# Patient Record
Sex: Female | Born: 1965
Health system: Southern US, Community
[De-identification: ages and names within clinical notes are randomized; demographics above are authoritative.]

## PROBLEM LIST (undated history)

## (undated) DIAGNOSIS — I10 Essential (primary) hypertension: Secondary | ICD-10-CM

## (undated) DIAGNOSIS — I219 Acute myocardial infarction, unspecified: Secondary | ICD-10-CM

## (undated) DIAGNOSIS — J342 Deviated nasal septum: Secondary | ICD-10-CM

## (undated) DIAGNOSIS — R011 Cardiac murmur, unspecified: Secondary | ICD-10-CM

## (undated) DIAGNOSIS — H9190 Unspecified hearing loss, unspecified ear: Secondary | ICD-10-CM

## (undated) DIAGNOSIS — R519 Headache, unspecified: Secondary | ICD-10-CM

## (undated) DIAGNOSIS — N83201 Unspecified ovarian cyst, right side: Secondary | ICD-10-CM

## (undated) DIAGNOSIS — Z98811 Dental restoration status: Secondary | ICD-10-CM

## (undated) DIAGNOSIS — J329 Chronic sinusitis, unspecified: Secondary | ICD-10-CM

## (undated) DIAGNOSIS — E079 Disorder of thyroid, unspecified: Secondary | ICD-10-CM

## (undated) DIAGNOSIS — K219 Gastro-esophageal reflux disease without esophagitis: Secondary | ICD-10-CM

## (undated) DIAGNOSIS — G510 Bell's palsy: Secondary | ICD-10-CM

## (undated) DIAGNOSIS — J343 Hypertrophy of nasal turbinates: Secondary | ICD-10-CM

## (undated) DIAGNOSIS — R51 Headache: Secondary | ICD-10-CM

## (undated) HISTORY — DX: Unspecified ovarian cyst, right side: N83.201

## (undated) HISTORY — DX: Bell's palsy: G51.0

## (undated) HISTORY — DX: Disorder of thyroid, unspecified: E07.9

## (undated) HISTORY — PX: ABDOMINAL HYSTERECTOMY: SHX81

## (undated) HISTORY — DX: Acute myocardial infarction, unspecified: I21.9

## (undated) HISTORY — DX: Cardiac murmur, unspecified: R01.1

## (undated) HISTORY — DX: Gastro-esophageal reflux disease without esophagitis: K21.9

## (undated) HISTORY — PX: OOPHORECTOMY: SHX86

---

## 2010-02-27 ENCOUNTER — Ambulatory Visit: Payer: Self-pay | Admitting: Family Medicine

## 2011-10-05 ENCOUNTER — Ambulatory Visit: Payer: Self-pay | Admitting: Family Medicine

## 2011-10-28 ENCOUNTER — Ambulatory Visit: Payer: Self-pay | Admitting: Family Medicine

## 2011-11-19 ENCOUNTER — Ambulatory Visit: Payer: Self-pay | Admitting: Internal Medicine

## 2011-11-29 LAB — CA 125: CA 125: 9.8 U/mL (ref 0.0–34.0)

## 2011-12-17 ENCOUNTER — Ambulatory Visit: Payer: Self-pay | Admitting: Internal Medicine

## 2012-01-17 ENCOUNTER — Ambulatory Visit: Payer: Self-pay | Admitting: Internal Medicine

## 2012-06-11 ENCOUNTER — Ambulatory Visit (INDEPENDENT_AMBULATORY_CARE_PROVIDER_SITE_OTHER): Payer: PRIVATE HEALTH INSURANCE | Admitting: Internal Medicine

## 2012-06-11 ENCOUNTER — Encounter: Payer: Self-pay | Admitting: Internal Medicine

## 2012-06-11 ENCOUNTER — Telehealth: Payer: Self-pay | Admitting: Internal Medicine

## 2012-06-11 VITALS — BP 140/100 | HR 85 | Temp 98.2°F | Ht 62.75 in | Wt 164.5 lb

## 2012-06-11 DIAGNOSIS — N83209 Unspecified ovarian cyst, unspecified side: Secondary | ICD-10-CM

## 2012-06-11 DIAGNOSIS — N83201 Unspecified ovarian cyst, right side: Secondary | ICD-10-CM | POA: Insufficient documentation

## 2012-06-11 DIAGNOSIS — R5383 Other fatigue: Secondary | ICD-10-CM | POA: Insufficient documentation

## 2012-06-11 DIAGNOSIS — D72829 Elevated white blood cell count, unspecified: Secondary | ICD-10-CM | POA: Insufficient documentation

## 2012-06-11 DIAGNOSIS — E039 Hypothyroidism, unspecified: Secondary | ICD-10-CM | POA: Insufficient documentation

## 2012-06-11 DIAGNOSIS — R5381 Other malaise: Secondary | ICD-10-CM

## 2012-06-11 NOTE — Assessment & Plan Note (Signed)
Will check recent TSH level drawn through her work. Will continue Synthroid 50 mcg daily.

## 2012-06-11 NOTE — Assessment & Plan Note (Signed)
Will request records on previous evaluation. Will set up a followup pelvic ultrasound to reassess the cyst. Patient may ultimately need CT of the abdomen, given perimbilical abdominal pain and her report of chronic leukocytosis. She will followup in 2 weeks after ultrasound completed.

## 2012-06-11 NOTE — Telephone Encounter (Signed)
I reviewed lab work from [facility] as well as notes from oncology. White blood cell count was only slightly elevated at the time of evaluation at 12.9 (in 2012). They performed extensive workup including evaluation for leukemia which was negative. I would recommend repeating a simple CBC in our office to confirm that white blood cell count has returned to normal. I suspect the elevation in white blood cell count may have been related to inflammation or infection at the time.  I also reviewed notes from the gynecologist oncologist. This cystic mass in the right ovary is likely benign, however we definitely need to proceed with the evaluation with pelvic ultrasound to make sure there are no changes. This has been scheduled and we can call patient with results as soon as they're available.

## 2012-06-11 NOTE — Progress Notes (Signed)
Subjective:    Patient ID: [name], female    DOB: Jan 19, 1966, 46 y.o.   MRN: [id number]  HPI 46 year old female with history of hypothyroidism and right ovarian cyst presents to establish care. She has several concerns today. First, she notes history of right ovarian cyst which has been evaluated by gynecologist oncologist in the past. She notes that she was scheduled to have followup ultrasound but did not follow through with this. She continues to have some abdominal pain, particularly during intercourse. She has not had any severe abdominal pain or fever, chills.  Second, she notes that she was told in the past that her white blood cell count was elevated. She reports that this was worked up extensively with lab work which was unrevealing. She has not had fever or chills. She has not had any other symptoms such as cough, dysuria or other focal signs of bacterial infection. She would like to get an answer as to the cause of her elevated white blood cell count.  Third, she notes several month history of fatigue. She denies any change in appetite, change in bowel habits, weight change, chest pain, dyspnea. She reports extensive lab work evaluation performed through her work and previous physician, however did not bring this with her today.  Outpatient Encounter Prescriptions as of 06/11/2012  Medication Sig Dispense Refill  . esomeprazole (NEXIUM) 40 MG capsule Take 40 mg by mouth daily before breakfast.      . levothyroxine (SYNTHROID, LEVOTHROID) 50 MCG tablet Take 50 mcg by mouth daily.        Review of Systems  Constitutional: Positive for fatigue. Negative for fever, chills, appetite change and unexpected weight change.  HENT: Negative for ear pain, congestion, sore throat, trouble swallowing, neck pain, voice change and sinus pressure.   Eyes: Negative for visual disturbance.  Respiratory: Negative for cough, shortness of breath, wheezing and stridor.   Cardiovascular: Negative for  chest pain, palpitations and leg swelling.  Gastrointestinal: Positive for abdominal pain. Negative for nausea, vomiting, diarrhea, constipation, blood in stool, abdominal distention and anal bleeding.  Genitourinary: Positive for dyspareunia. Negative for dysuria and flank pain.  Musculoskeletal: Negative for myalgias, arthralgias and gait problem.  Skin: Negative for color change and rash.  Neurological: Negative for dizziness and headaches.  Hematological: Negative for adenopathy. Does not bruise/bleed easily.  Psychiatric/Behavioral: Negative for suicidal ideas, disturbed wake/sleep cycle and dysphoric mood. The patient is not nervous/anxious.    BP 140/100  Pulse 85  Temp 98.2 F (36.8 C) (Oral)  Ht 5' 2.75" (1.594 m)  Wt 164 lb 8 oz (74.617 kg)  BMI 29.37 kg/m2  SpO2 96%     Objective:   Physical Exam  Constitutional: She is oriented to person, place, and time. She appears well-developed and well-nourished. No distress.  HENT:  Head: Normocephalic and atraumatic.  Right Ear: External ear normal.  Left Ear: External ear normal.  Nose: Nose normal.  Mouth/Throat: Oropharynx is clear and moist. No oropharyngeal exudate.  Eyes: Conjunctivae are normal. Pupils are equal, round, and reactive to light. Right eye exhibits no discharge. Left eye exhibits no discharge. No scleral icterus.  Neck: Normal range of motion. Neck supple. No tracheal deviation present. No thyromegaly present.  Cardiovascular: Normal rate, regular rhythm, normal heart sounds and intact distal pulses.  Exam reveals no gallop and no friction rub.   No murmur heard. Pulmonary/Chest: Effort normal and breath sounds normal. No respiratory distress. She has no wheezes. She has no rales. She exhibits  no tenderness.  Abdominal: Soft. Bowel sounds are normal. She exhibits no distension and no mass. There is tenderness (periumbilical). There is no rebound and no guarding.  Musculoskeletal: Normal range of motion. She  exhibits no edema and no tenderness.  Lymphadenopathy:    She has no cervical adenopathy.  Neurological: She is alert and oriented to person, place, and time. No cranial nerve deficit. She exhibits normal muscle tone. Coordination normal.  Skin: Skin is warm and dry. No rash noted. She is not diaphoretic. No erythema. No pallor.  Psychiatric: She has a normal mood and affect. Her behavior is normal. Judgment and thought content normal.          Assessment & Plan:

## 2012-06-11 NOTE — Assessment & Plan Note (Signed)
Patient reports history of leukocytosis. We'll request records on lab work and evaluation. No focal signs of infection or other abnormalities today on exam. Follow up 2 weeks.

## 2012-06-11 NOTE — Assessment & Plan Note (Signed)
Unclear etiology. No focal symptoms. We'll request records on previous evaluation including lab work. Will touch base with patient after lab work reviewed.

## 2012-06-12 ENCOUNTER — Other Ambulatory Visit (INDEPENDENT_AMBULATORY_CARE_PROVIDER_SITE_OTHER): Payer: PRIVATE HEALTH INSURANCE | Admitting: *Deleted

## 2012-06-12 DIAGNOSIS — R7989 Other specified abnormal findings of blood chemistry: Secondary | ICD-10-CM

## 2012-06-12 LAB — CBC WITH DIFFERENTIAL/PLATELET
Basophils Relative: 1.1 % (ref 0.0–3.0)
Eosinophils Relative: 1.1 % (ref 0.0–5.0)
HCT: 40.8 % (ref 36.0–46.0)
Hemoglobin: 13.7 g/dL (ref 12.0–15.0)
Lymphs Abs: 3.7 10*3/uL (ref 0.7–4.0)
MCV: 93.1 fl (ref 78.0–100.0)
Monocytes Absolute: 0.7 10*3/uL (ref 0.1–1.0)
Monocytes Relative: 5.7 % (ref 3.0–12.0)
RBC: 4.38 Mil/uL (ref 3.87–5.11)
WBC: 11.6 10*3/uL — ABNORMAL HIGH (ref 4.5–10.5)

## 2012-06-12 NOTE — Telephone Encounter (Signed)
Patient advised as instructed via telephone, she is scheduled for pelvic ultrasound in July.  She will stop by today to have CBC done.

## 2012-06-16 ENCOUNTER — Encounter: Payer: Self-pay | Admitting: *Deleted

## 2012-06-16 ENCOUNTER — Telehealth: Payer: Self-pay | Admitting: Internal Medicine

## 2012-06-16 ENCOUNTER — Ambulatory Visit: Payer: Self-pay | Admitting: Internal Medicine

## 2012-06-16 NOTE — Telephone Encounter (Signed)
Pelvic ultrasound showed small ovarian cysts bilaterally. There were no masses or other abnormalities.

## 2012-06-17 NOTE — Telephone Encounter (Signed)
Left message on cell phone voicemail for patient to return call. 

## 2012-06-19 NOTE — Telephone Encounter (Signed)
Called patient at work no answer or voicemail, left message on cell phone voicemail for patient to return call.

## 2012-06-22 NOTE — Telephone Encounter (Signed)
Patient advised as instructed via telephone and advised of lab results from 06/12/2012.

## 2012-06-23 ENCOUNTER — Encounter: Payer: Self-pay | Admitting: Internal Medicine

## 2012-07-14 ENCOUNTER — Ambulatory Visit (INDEPENDENT_AMBULATORY_CARE_PROVIDER_SITE_OTHER): Payer: PRIVATE HEALTH INSURANCE | Admitting: Internal Medicine

## 2012-07-14 ENCOUNTER — Encounter: Payer: Self-pay | Admitting: Internal Medicine

## 2012-07-14 VITALS — BP 120/80 | HR 80 | Temp 98.4°F | Ht 62.75 in | Wt 161.8 lb

## 2012-07-14 DIAGNOSIS — M25512 Pain in left shoulder: Secondary | ICD-10-CM

## 2012-07-14 DIAGNOSIS — N83201 Unspecified ovarian cyst, right side: Secondary | ICD-10-CM

## 2012-07-14 DIAGNOSIS — N83209 Unspecified ovarian cyst, unspecified side: Secondary | ICD-10-CM

## 2012-07-14 DIAGNOSIS — D72829 Elevated white blood cell count, unspecified: Secondary | ICD-10-CM

## 2012-07-14 DIAGNOSIS — F419 Anxiety disorder, unspecified: Secondary | ICD-10-CM | POA: Insufficient documentation

## 2012-07-14 DIAGNOSIS — M25519 Pain in unspecified shoulder: Secondary | ICD-10-CM

## 2012-07-14 DIAGNOSIS — F411 Generalized anxiety disorder: Secondary | ICD-10-CM

## 2012-07-14 MED ORDER — BUPROPION HCL ER (XL) 150 MG PO TB24: 150.0000 mg | ORAL_TABLET | Freq: Every day | ORAL | Status: DC

## 2012-07-14 MED ORDER — CYCLOBENZAPRINE HCL 5 MG PO TABS: 5.0000 mg | ORAL_TABLET | Freq: Three times a day (TID) | ORAL | Status: AC | PRN

## 2012-07-14 NOTE — Assessment & Plan Note (Signed)
Recent pelvic ultrasound showed bilateral ovarian cysts, with a simple cyst on the right and septated cyst on the left. Will continue to monitor.

## 2012-07-14 NOTE — Assessment & Plan Note (Signed)
Recently blood cell count 11.6. This is stable with previous values. Suspect this is normal for this patient. Extensive evaluation in the past has been unrevealing.

## 2012-07-14 NOTE — Assessment & Plan Note (Signed)
Symptoms are worsening. Will try adding Wellbutrin to see if any improvement. Patient will followup in one month.

## 2012-07-14 NOTE — Progress Notes (Signed)
Subjective:    Patient ID: [name], female    DOB: 1966-10-06, 46 y.o.   MRN: [id number]  HPI 46 year old female with history of leukocytosis and ovarian cyst presents for followup. In the interim since her last visit, she had CBC performed which showed slightly elevated white blood cell count which was consistent with previous levels. Extensive evaluation in the past was unremarkable. She also had pelvic ultrasound which showed small ovarian cyst but no obvious mass or other abnormality.  Her primary concern today is increased anxiety and irritable mood. She has noticed this over several months. In the past, she took both Zoloft and another unknown antidepressant medication with minimal improvement. She would like to start on medication again.  Patient is also concerned about left shoulder pain. This has been present for several months. Pain is worse with increased physical activity. Patient has taken nonsteroidals such as ibuprofen with minimal improvement.  Outpatient Encounter Prescriptions as of 07/14/2012  Medication Sig Dispense Refill  . esomeprazole (NEXIUM) 40 MG capsule Take 40 mg by mouth daily before breakfast.      . levothyroxine (SYNTHROID, LEVOTHROID) 50 MCG tablet Take 50 mcg by mouth daily.      Marland Kitchen buPROPion (WELLBUTRIN XL) 150 MG 24 hr tablet Take 1 tablet (150 mg total) by mouth daily.  30 tablet  3  . cyclobenzaprine (FLEXERIL) 5 MG tablet Take 1 tablet (5 mg total) by mouth 3 (three) times daily as needed for muscle spasms.  30 tablet  1    Review of Systems  Constitutional: Negative for fever, chills, appetite change, fatigue and unexpected weight change.  HENT: Negative for ear pain, congestion, sore throat, trouble swallowing, neck pain, voice change and sinus pressure.   Eyes: Negative for visual disturbance.  Respiratory: Negative for cough, shortness of breath, wheezing and stridor.   Cardiovascular: Negative for chest pain, palpitations and leg swelling.    Gastrointestinal: Negative for nausea, vomiting, abdominal pain, diarrhea, constipation, blood in stool, abdominal distention and anal bleeding.  Genitourinary: Negative for dysuria and flank pain.  Musculoskeletal: Negative for myalgias, arthralgias and gait problem.  Skin: Negative for color change and rash.  Neurological: Negative for dizziness and headaches.  Hematological: Negative for adenopathy. Does not bruise/bleed easily.  Psychiatric/Behavioral: Positive for dysphoric mood. Negative for suicidal ideas and disturbed wake/sleep cycle. The patient is nervous/anxious.    BP 120/80  Pulse 80  Temp 98.4 F (36.9 C) (Oral)  Ht 5' 2.75" (1.594 m)  Wt 161 lb 12 oz (73.369 kg)  BMI 28.88 kg/m2  SpO2 98%     Objective:   Physical Exam  Constitutional: She is oriented to person, place, and time. She appears well-developed and well-nourished. No distress.  HENT:  Head: Normocephalic and atraumatic.  Right Ear: External ear normal.  Left Ear: External ear normal.  Nose: Nose normal.  Mouth/Throat: Oropharynx is clear and moist. No oropharyngeal exudate.  Eyes: Conjunctivae are normal. Pupils are equal, round, and reactive to light. Right eye exhibits no discharge. Left eye exhibits no discharge. No scleral icterus.  Neck: Normal range of motion. Neck supple. No tracheal deviation present. No thyromegaly present.  Cardiovascular: Normal rate, regular rhythm, normal heart sounds and intact distal pulses.  Exam reveals no gallop and no friction rub.   No murmur heard. Pulmonary/Chest: Effort normal and breath sounds normal. No respiratory distress. She has no wheezes. She has no rales. She exhibits no tenderness.  Musculoskeletal: Normal range of motion. She exhibits no edema and  no tenderness.       Cervical back: She exhibits pain and spasm.  Lymphadenopathy:    She has no cervical adenopathy.  Neurological: She is alert and oriented to person, place, and time. No cranial nerve  deficit. She exhibits normal muscle tone. Coordination normal.  Skin: Skin is warm and dry. No rash noted. She is not diaphoretic. No erythema. No pallor.  Psychiatric: She has a normal mood and affect. Her behavior is normal. Judgment and thought content normal.          Assessment & Plan:

## 2012-07-14 NOTE — Assessment & Plan Note (Signed)
Patient with mild left shoulder pain which is most consistent with spasm of the left trapezius muscle. Will try adding Flexeril to see if any improvement.

## 2012-08-13 ENCOUNTER — Ambulatory Visit: Payer: PRIVATE HEALTH INSURANCE | Admitting: Internal Medicine

## 2012-08-18 ENCOUNTER — Encounter: Payer: Self-pay | Admitting: Internal Medicine

## 2012-08-18 ENCOUNTER — Ambulatory Visit (INDEPENDENT_AMBULATORY_CARE_PROVIDER_SITE_OTHER): Payer: PRIVATE HEALTH INSURANCE | Admitting: Internal Medicine

## 2012-08-18 VITALS — BP 140/100 | HR 100 | Temp 98.6°F | Ht 62.75 in | Wt 160.5 lb

## 2012-08-18 DIAGNOSIS — J40 Bronchitis, not specified as acute or chronic: Secondary | ICD-10-CM

## 2012-08-18 DIAGNOSIS — M25519 Pain in unspecified shoulder: Secondary | ICD-10-CM

## 2012-08-18 DIAGNOSIS — F419 Anxiety disorder, unspecified: Secondary | ICD-10-CM

## 2012-08-18 DIAGNOSIS — M25512 Pain in left shoulder: Secondary | ICD-10-CM

## 2012-08-18 DIAGNOSIS — F411 Generalized anxiety disorder: Secondary | ICD-10-CM

## 2012-08-18 MED ORDER — ALBUTEROL SULFATE HFA 108 (90 BASE) MCG/ACT IN AERS: 2.0000 | INHALATION_SPRAY | Freq: Four times a day (QID) | RESPIRATORY_TRACT | Status: DC | PRN

## 2012-08-18 MED ORDER — PREDNISONE (PAK) 10 MG PO TABS: ORAL_TABLET | ORAL | Status: AC

## 2012-08-18 MED ORDER — AMOXICILLIN-POT CLAVULANATE 875-125 MG PO TABS: 1.0000 | ORAL_TABLET | Freq: Two times a day (BID) | ORAL | Status: AC

## 2012-08-18 MED ORDER — BUPROPION HCL ER (XL) 300 MG PO TB24: 300.0000 mg | ORAL_TABLET | Freq: Every day | ORAL | Status: DC

## 2012-08-18 MED ORDER — TRAMADOL HCL 50 MG PO TABS: 50.0000 mg | ORAL_TABLET | Freq: Three times a day (TID) | ORAL | Status: AC | PRN

## 2012-08-18 MED ORDER — BENZONATATE 200 MG PO CAPS: 200.0000 mg | ORAL_CAPSULE | Freq: Three times a day (TID) | ORAL | Status: AC | PRN

## 2012-08-18 NOTE — Assessment & Plan Note (Signed)
Symptoms are consistent with bronchitis. Will treat with antibiotics and prednisone taper. Will also start albuterol inhaler as needed. Followup if symptoms are not improving over next 72 hours.

## 2012-08-18 NOTE — Progress Notes (Signed)
Subjective:    Patient ID: [name], female    DOB: 10-04-1966, 45 y.o.   MRN: [id number]  HPI 46 year old female with history of anxiety presents for followup. At her last visit, she was started on Wellbutrin. She reports no noticeable difference in anxiety symptoms since starting this medication. She denies any noted side effects from the medication. She continues to have episodes of anxious mood.  She is also concerned today about several day history of sinus drainage, cough, and mild shortness of breath. She denies fever or chills. She denies chest pain. She has not been taking any medication for this. She is a current smoker.  She is also concerned today about ongoing issues with left upper back and shoulder pain. She has been treated with nonsteroidals including Aleve, meloxicam, and ibuprofen. At her last visit, we tried starting Flexeril to help with spasm of her trapezius muscle. She reports no improvement on this medication. She denies any weakness in her left arm, decreased range of motion of her shoulder. She has been performing exercises at her gym with no improvement in symptoms.  Outpatient Encounter Prescriptions as of 08/18/2012  Medication Sig Dispense Refill  . buPROPion (WELLBUTRIN XL) 300 MG 24 hr tablet Take 1 tablet (300 mg total) by mouth daily.  30 tablet  3  . esomeprazole (NEXIUM) 40 MG capsule Take 40 mg by mouth daily before breakfast.      . levothyroxine (SYNTHROID, LEVOTHROID) 50 MCG tablet Take 50 mcg by mouth daily.      Marland Kitchen DISCONTD: buPROPion (WELLBUTRIN XL) 150 MG 24 hr tablet Take 1 tablet (150 mg total) by mouth daily.  30 tablet  3  . albuterol (PROVENTIL HFA;VENTOLIN HFA) 108 (90 BASE) MCG/ACT inhaler Inhale 2 puffs into the lungs every 6 (six) hours as needed for wheezing.  1 Inhaler  0  . amoxicillin-clavulanate (AUGMENTIN) 875-125 MG per tablet Take 1 tablet by mouth 2 (two) times daily.  20 tablet  0  . benzonatate (TESSALON) 200 MG capsule Take 1  capsule (200 mg total) by mouth 3 (three) times daily as needed for cough.  20 capsule  0  . predniSONE (STERAPRED UNI-PAK) 10 MG tablet Take 60mg  day 1 then taper by 10mg  daily  21 tablet  0  . traMADol (ULTRAM) 50 MG tablet Take 1 tablet (50 mg total) by mouth every 8 (eight) hours as needed for pain.  30 tablet  3   BP 140/100  Pulse 100  Temp 98.6 F (37 C) (Oral)  Ht 5' 2.75" (1.594 m)  Wt 160 lb 8 oz (72.802 kg)  BMI 28.66 kg/m2  SpO2 97%  Review of Systems  Constitutional: Positive for fatigue. Negative for fever, chills, appetite change and unexpected weight change.  HENT: Positive for congestion and rhinorrhea. Negative for ear pain, sore throat, trouble swallowing, neck pain, voice change and sinus pressure.   Eyes: Negative for visual disturbance.  Respiratory: Positive for cough and shortness of breath. Negative for wheezing and stridor.   Cardiovascular: Negative for chest pain, palpitations and leg swelling.  Gastrointestinal: Negative for nausea, vomiting, abdominal pain, diarrhea, constipation, blood in stool, abdominal distention and anal bleeding.  Genitourinary: Negative for dysuria and flank pain.  Musculoskeletal: Positive for myalgias and arthralgias. Negative for gait problem.  Skin: Negative for color change and rash.  Neurological: Negative for dizziness and headaches.  Hematological: Negative for adenopathy. Does not bruise/bleed easily.  Psychiatric/Behavioral: Negative for suicidal ideas, disturbed wake/sleep cycle and dysphoric mood. The  patient is nervous/anxious.        Objective:   Physical Exam  Constitutional: She is oriented to person, place, and time. She appears well-developed and well-nourished. No distress.  HENT:  Head: Normocephalic and atraumatic.  Right Ear: External ear normal.  Left Ear: External ear normal.  Nose: Nose normal.  Mouth/Throat: Oropharynx is clear and moist. No oropharyngeal exudate.  Eyes: Conjunctivae are normal.  Pupils are equal, round, and reactive to light. Right eye exhibits no discharge. Left eye exhibits no discharge. No scleral icterus.  Neck: Normal range of motion. Neck supple. No tracheal deviation present. No thyromegaly present.  Cardiovascular: Normal rate, regular rhythm, normal heart sounds and intact distal pulses.  Exam reveals no gallop and no friction rub.   No murmur heard. Pulmonary/Chest: Effort normal. No accessory muscle usage. Not tachypneic. No respiratory distress. She has decreased breath sounds (prolonged expiratory phase). She has no wheezes. Rhonchi: scattered. She has no rales. She exhibits no tenderness.  Musculoskeletal: Normal range of motion. She exhibits no edema and no tenderness.       Cervical back: She exhibits tenderness (left shoulder/upper back), pain and spasm. She exhibits normal range of motion.  Lymphadenopathy:    She has no cervical adenopathy.  Neurological: She is alert and oriented to person, place, and time. No cranial nerve deficit. She exhibits normal muscle tone. Coordination normal.  Skin: Skin is warm and dry. No rash noted. She is not diaphoretic. No erythema. No pallor.  Psychiatric: She has a normal mood and affect. Her behavior is normal. Judgment and thought content normal.          Assessment & Plan:

## 2012-08-18 NOTE — Assessment & Plan Note (Signed)
Symptoms are persistent despite treatment with nonsteroidals and Flexeril. Will try adding tramadol. Will set up orthopedics referral for further evaluation.

## 2012-08-18 NOTE — Patient Instructions (Addendum)

## 2012-08-18 NOTE — Assessment & Plan Note (Signed)
Anxiety and changed with Wellbutrin. Will try increasing dose to 300 mg daily. Followup one month.

## 2012-09-16 ENCOUNTER — Ambulatory Visit: Payer: PRIVATE HEALTH INSURANCE | Admitting: Internal Medicine

## 2012-09-16 DIAGNOSIS — Z0289 Encounter for other administrative examinations: Secondary | ICD-10-CM

## 2013-03-30 ENCOUNTER — Ambulatory Visit: Payer: Self-pay | Admitting: Internal Medicine

## 2013-03-31 ENCOUNTER — Telehealth: Payer: Self-pay | Admitting: Internal Medicine

## 2013-03-31 NOTE — Telephone Encounter (Signed)
Mammogram 03/30/2013 showed asymmetry right breast. They have made request for additional views. Has this been scheduled?

## 2013-04-01 ENCOUNTER — Ambulatory Visit: Payer: Self-pay | Admitting: Internal Medicine

## 2013-04-01 NOTE — Telephone Encounter (Signed)
Patient informed and has appointment scheduled today at 230

## 2013-04-22 ENCOUNTER — Encounter: Payer: Self-pay | Admitting: Internal Medicine

## 2013-04-23 ENCOUNTER — Encounter: Payer: Self-pay | Admitting: Internal Medicine

## 2013-07-19 ENCOUNTER — Telehealth: Payer: Self-pay | Admitting: *Deleted

## 2013-07-19 NOTE — Telephone Encounter (Signed)
Dr. Koren Shiver office in [place].

## 2013-07-19 NOTE — Telephone Encounter (Signed)
Can you find out where that PAP was performed so that we can get the record? If normal, PAPs are generally every 3-5 years, but we need to verify this was normal.

## 2013-07-19 NOTE — Telephone Encounter (Addendum)
Patient left voicemail, she would like to know if it is time for her yearly pap? According to [facility] records, last pap was in 05/2012 and it was normal.

## 2013-07-20 NOTE — Telephone Encounter (Signed)
Left message to call back or fax information at Dr. Dossie Arbour office.

## 2013-07-23 ENCOUNTER — Encounter: Payer: Self-pay | Admitting: *Deleted

## 2013-07-23 NOTE — Telephone Encounter (Signed)
OK. Then we should set her up for physical at her next visit with PAP smear.

## 2013-07-23 NOTE — Telephone Encounter (Signed)
Appointment scheduled for 09/13/13 @ 915

## 2013-07-23 NOTE — Telephone Encounter (Signed)
Left message to call back  

## 2013-07-23 NOTE — Telephone Encounter (Signed)
They could not find a pap smear in her chart.

## 2013-09-07 LAB — HM PAP SMEAR: HM Pap smear: NEGATIVE

## 2013-09-13 ENCOUNTER — Ambulatory Visit (INDEPENDENT_AMBULATORY_CARE_PROVIDER_SITE_OTHER): Payer: BC Managed Care – PPO | Admitting: Internal Medicine

## 2013-09-13 ENCOUNTER — Encounter: Payer: Self-pay | Admitting: Internal Medicine

## 2013-09-13 ENCOUNTER — Other Ambulatory Visit (HOSPITAL_COMMUNITY)
Admission: RE | Admit: 2013-09-13 | Discharge: 2013-09-13 | Disposition: A | Discharge status: Home / Self Care | Payer: BC Managed Care – PPO | Source: Ambulatory Visit | Attending: Internal Medicine | Admitting: Internal Medicine

## 2013-09-13 VITALS — BP 150/96 | HR 74 | Temp 98.3°F | Ht 62.0 in | Wt 162.0 lb

## 2013-09-13 DIAGNOSIS — Z0000 Encounter for general adult medical examination without abnormal findings: Secondary | ICD-10-CM

## 2013-09-13 DIAGNOSIS — F17200 Nicotine dependence, unspecified, uncomplicated: Secondary | ICD-10-CM

## 2013-09-13 DIAGNOSIS — Z1151 Encounter for screening for human papillomavirus (HPV): Secondary | ICD-10-CM | POA: Insufficient documentation

## 2013-09-13 DIAGNOSIS — Z01419 Encounter for gynecological examination (general) (routine) without abnormal findings: Secondary | ICD-10-CM | POA: Insufficient documentation

## 2013-09-13 DIAGNOSIS — N83209 Unspecified ovarian cyst, unspecified side: Secondary | ICD-10-CM

## 2013-09-13 DIAGNOSIS — N83201 Unspecified ovarian cyst, right side: Secondary | ICD-10-CM

## 2013-09-13 LAB — CBC WITH DIFFERENTIAL/PLATELET
Basophils Relative: 1 % (ref 0.0–3.0)
Eosinophils Absolute: 0.1 10*3/uL (ref 0.0–0.7)
Eosinophils Relative: 0.9 % (ref 0.0–5.0)
Lymphocytes Relative: 24.6 % (ref 12.0–46.0)
Monocytes Absolute: 0.5 10*3/uL (ref 0.1–1.0)
Neutrophils Relative %: 69.7 % (ref 43.0–77.0)
Platelets: 267 10*3/uL (ref 150.0–400.0)
RBC: 4.56 Mil/uL (ref 3.87–5.11)
WBC: 12.8 10*3/uL — ABNORMAL HIGH (ref 4.5–10.5)

## 2013-09-13 LAB — LIPID PANEL
HDL: 40.4 mg/dL (ref >39.00)
Total CHOL/HDL Ratio: 5
VLDL: 36.6 mg/dL (ref 0.0–40.0)

## 2013-09-13 LAB — COMPREHENSIVE METABOLIC PANEL
ALT: 14 U/L (ref 0–35)
AST: 18 U/L (ref 0–37)
Alkaline Phosphatase: 65 U/L (ref 39–117)
Creatinine, Ser: 0.7 mg/dL (ref 0.4–1.2)
Sodium: 139 mEq/L (ref 135–145)
Total Bilirubin: 0.7 mg/dL (ref 0.3–1.2)
Total Protein: 7.3 g/dL (ref 6.0–8.3)

## 2013-09-13 NOTE — Assessment & Plan Note (Signed)
General medical exam including breast and pelvic exam normal today. Mammogram UTD. Vaccinations UTD. Will check labs including CBC, CMP, lipids, TSH today. Encouraged healthy diet and regular physical activity. Follow up 1 year for physical and prn.

## 2013-09-13 NOTE — Assessment & Plan Note (Signed)
Encouraged smoking cessation 

## 2013-09-13 NOTE — Progress Notes (Signed)
Subjective:    Patient ID: [name], female    DOB: 09-12-1966, 47 y.o.   MRN: [id number]  HPI 47YO female with h/o hypothyroidism presents for annual exam. Generally feels well.  Notes some fatigue, which she attributes to perimenopause. Not following any particular diet or exercise program. UTD on mammogram. Continues to smoke about 10 cigs per day, down from 2 PPD.  Outpatient Encounter Prescriptions as of 09/13/2013  Medication Sig Dispense Refill  . esomeprazole (NEXIUM) 40 MG capsule Take 40 mg by mouth daily before breakfast.      . levothyroxine (SYNTHROID, LEVOTHROID) 50 MCG tablet Take 50 mcg by mouth daily.       No facility-administered encounter medications on file as of 09/13/2013.   BP 150/96  Pulse 74  Temp(Src) 98.3 F (36.8 C) (Oral)  Ht 5\' 2"  (1.575 m)  Wt 162 lb (73.483 kg)  BMI 29.62 kg/m2  SpO2 97%  Review of Systems  Constitutional: Negative for fever, chills, appetite change, fatigue and unexpected weight change.  HENT: Negative for ear pain, congestion, sore throat, trouble swallowing, neck pain, voice change and sinus pressure.   Eyes: Negative for visual disturbance.  Respiratory: Negative for cough, shortness of breath, wheezing and stridor.   Cardiovascular: Negative for chest pain, palpitations and leg swelling.  Gastrointestinal: Negative for nausea, vomiting, abdominal pain, diarrhea, constipation, blood in stool, abdominal distention and anal bleeding.  Genitourinary: Negative for dysuria and flank pain.  Musculoskeletal: Negative for myalgias, arthralgias and gait problem.  Skin: Negative for color change and rash.  Neurological: Negative for dizziness and headaches.  Hematological: Negative for adenopathy. Does not bruise/bleed easily.  Psychiatric/Behavioral: Negative for suicidal ideas, sleep disturbance and dysphoric mood. The patient is not nervous/anxious.        Objective:   Physical Exam  Constitutional: She is oriented to person,  place, and time. She appears well-developed and well-nourished. No distress.  HENT:  Head: Normocephalic and atraumatic.  Right Ear: External ear normal.  Left Ear: External ear normal.  Nose: Nose normal.  Mouth/Throat: Oropharynx is clear and moist. No oropharyngeal exudate.  Eyes: Conjunctivae are normal. Pupils are equal, round, and reactive to light. Right eye exhibits no discharge. Left eye exhibits no discharge. No scleral icterus.  Neck: Normal range of motion. Neck supple. No tracheal deviation present. No thyromegaly present.  Cardiovascular: Normal rate, regular rhythm, normal heart sounds and intact distal pulses.  Exam reveals no gallop and no friction rub.   No murmur heard. Pulmonary/Chest: Effort normal and breath sounds normal. No respiratory distress. She has no wheezes. She has no rales. She exhibits no tenderness.  Abdominal: Soft. Bowel sounds are normal. She exhibits no distension and no mass. There is no tenderness. There is no rebound and no guarding.  Genitourinary: Rectum normal, vagina normal and uterus normal. No breast swelling, tenderness, discharge or bleeding. Pelvic exam was performed with patient supine. There is no rash, tenderness or lesion on the right labia. There is no rash, tenderness or lesion on the left labia. Right adnexum displays no mass, no tenderness and no fullness. Left adnexum displays no mass, no tenderness and no fullness. No erythema or tenderness around the vagina. No vaginal discharge found.  Uterus surgically absent  Musculoskeletal: Normal range of motion. She exhibits no edema and no tenderness.  Lymphadenopathy:    She has no cervical adenopathy.  Neurological: She is alert and oriented to person, place, and time. No cranial nerve deficit. She exhibits normal muscle tone.  Coordination normal.  Skin: Skin is warm and dry. No rash noted. She is not diaphoretic. No erythema. No pallor.  Psychiatric: She has a normal mood and affect. Her  behavior is normal. Judgment and thought content normal.          Assessment & Plan:

## 2013-09-13 NOTE — Assessment & Plan Note (Signed)
Pt was previously evaluated with Dr. Hyacinth Meeker in [facility]. Will request records on planned follow up. Currently asymptomatic. Exam normal.

## 2013-09-14 ENCOUNTER — Telehealth: Payer: Self-pay | Admitting: Internal Medicine

## 2013-09-14 DIAGNOSIS — N83209 Unspecified ovarian cyst, unspecified side: Secondary | ICD-10-CM

## 2013-09-14 NOTE — Telephone Encounter (Signed)
Per notes, Ms. [name] was supposed to have a follow up with GYN for repeat US in 4 months after initial visit in Jan 2013. I don't see that she ever had this? Can you ask if she followed up somewhere else?

## 2013-09-15 ENCOUNTER — Encounter: Payer: Self-pay | Admitting: *Deleted

## 2013-09-21 NOTE — Telephone Encounter (Signed)
Patient returned call, state she does not recall ever going back for follow up and has not followed up with anyone else.

## 2013-09-21 NOTE — Telephone Encounter (Signed)
Left message to call back  

## 2013-09-25 NOTE — Telephone Encounter (Signed)
Amber, Can we set her up for a pelvic US?

## 2013-09-27 NOTE — Telephone Encounter (Signed)
Yes maam, please place an order.

## 2013-10-05 ENCOUNTER — Ambulatory Visit: Payer: Self-pay | Admitting: Internal Medicine

## 2013-10-06 ENCOUNTER — Telehealth: Payer: Self-pay | Admitting: Internal Medicine

## 2013-10-06 DIAGNOSIS — N83209 Unspecified ovarian cyst, unspecified side: Secondary | ICD-10-CM

## 2013-10-06 NOTE — Telephone Encounter (Signed)
Left message to call back  

## 2013-10-06 NOTE — Telephone Encounter (Signed)
Pelvic US from 10/21 showed persistent cyst in the left ovary, measuring 3.92x2.58x2.64cm. I would recommend GYN consultation for possible removal of this cyst. Can you ask pt if she has a preferred provider?

## 2013-10-06 NOTE — Telephone Encounter (Signed)
Patient returned call and was given results of Ultrasound, did not have a preference to whom. Refer to whomever you prefer

## 2013-10-25 ENCOUNTER — Encounter: Payer: Self-pay | Admitting: Internal Medicine

## 2014-01-26 IMAGING — US TRANSABDOMINAL ULTRASOUND OF PELVIS
1 series · 14 of 25 positions shown · non-contrast
Comparison: none

REASON FOR EXAM: rt ovarian  cyst
COMMENTS:

[Series 1: transabdominal ultrasound of pelvis · 0.30mm/px · 84 acquisitions, 14 frames shown]
[im 1/84]
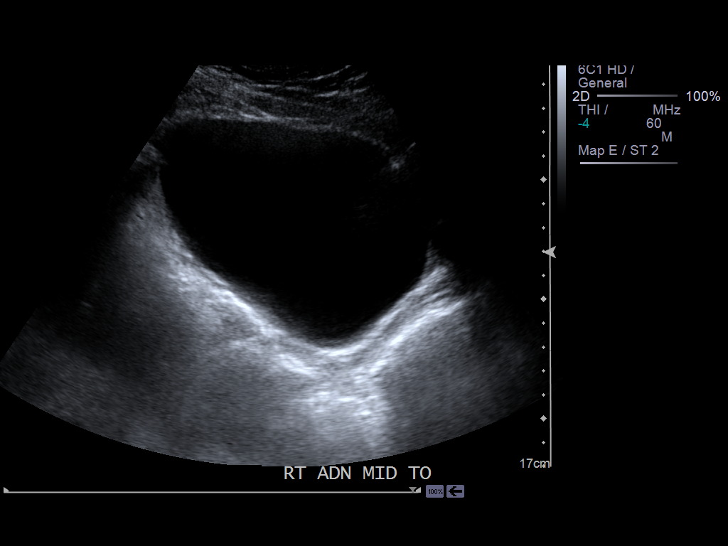
[im 7/84]
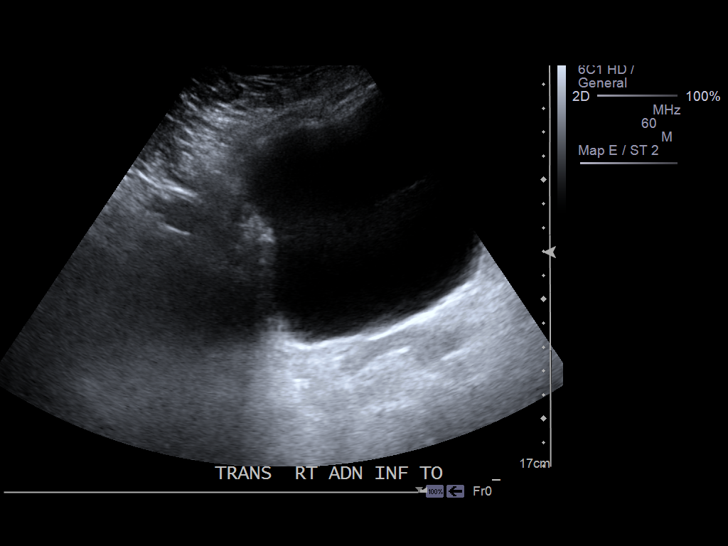
[im 14/84]
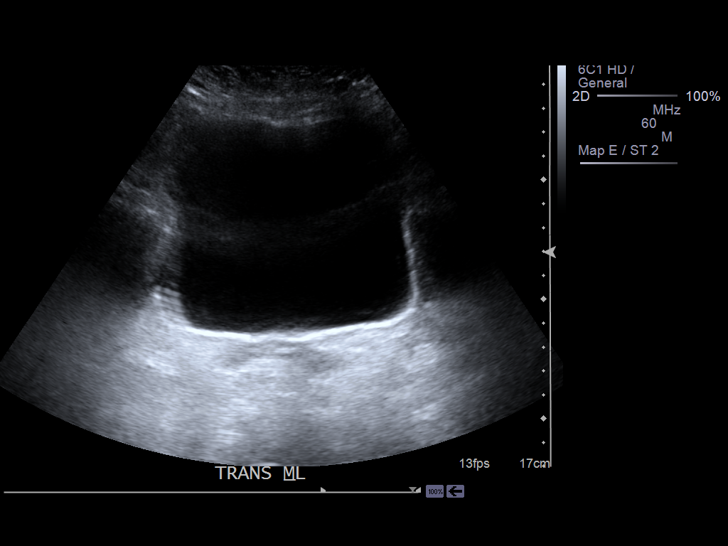
[im 21/84]
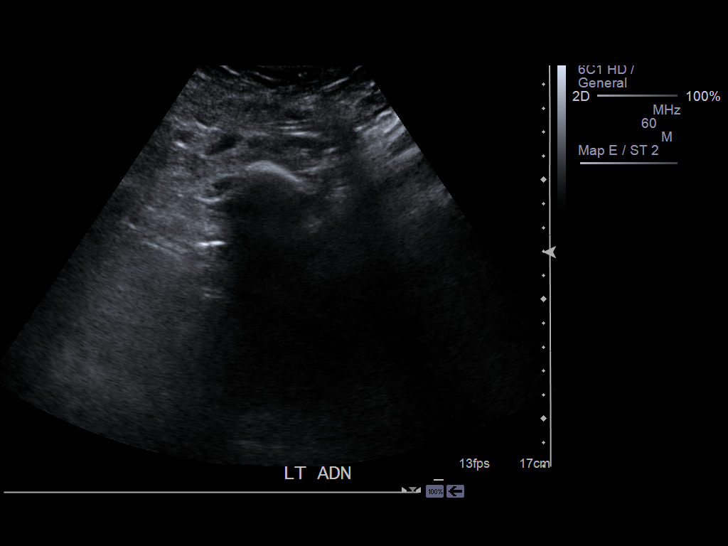
[im 28/84]
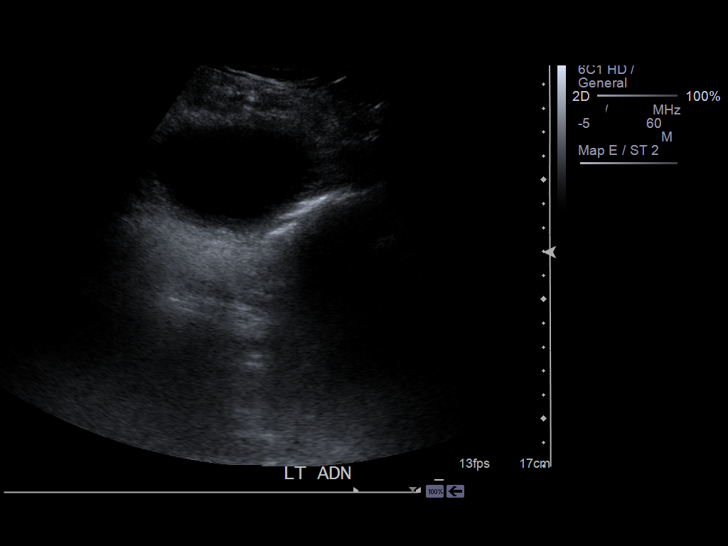
[im 32/84]
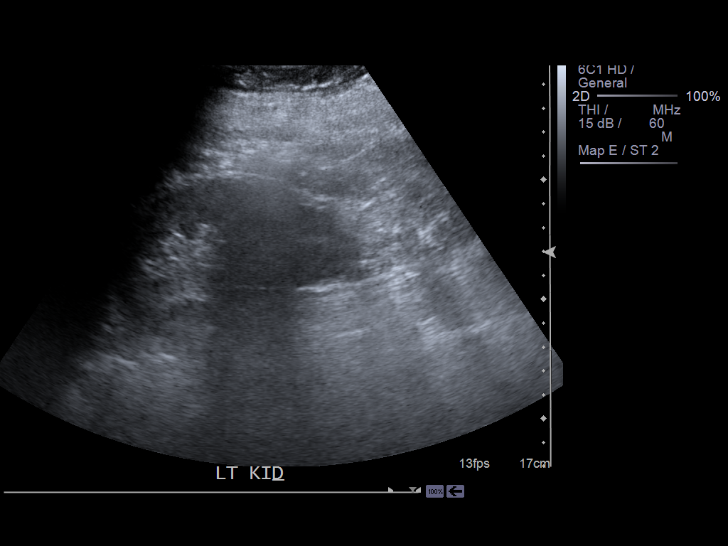
[im 39/84]
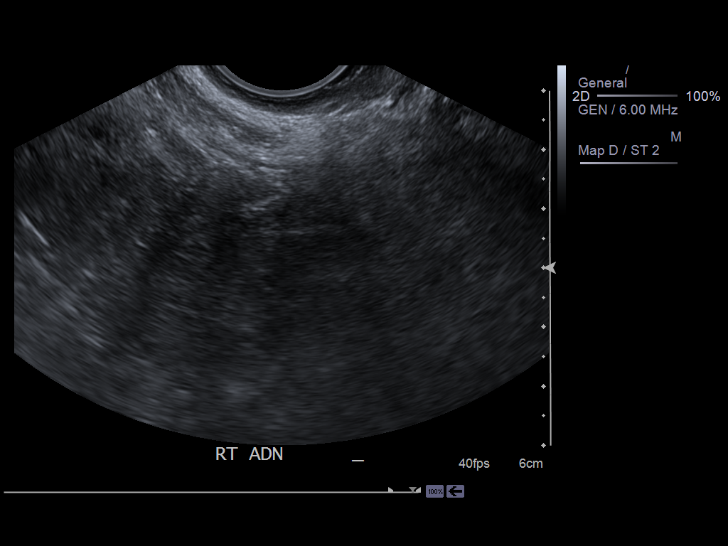
[im 45/84]
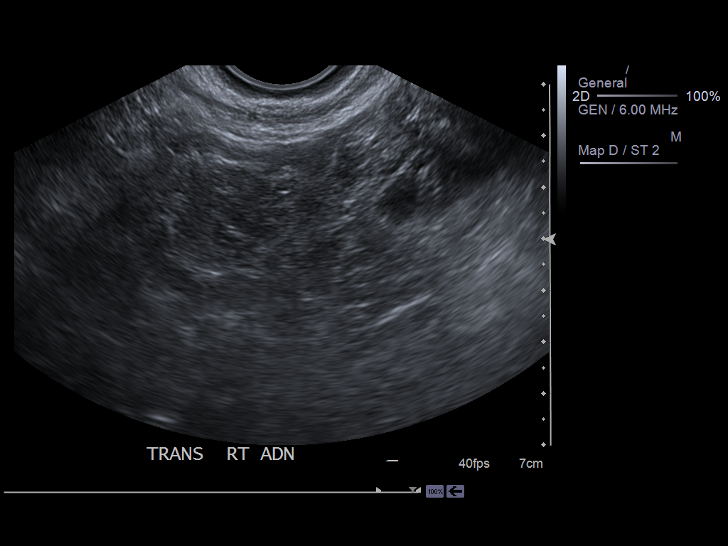
[im 52/84]
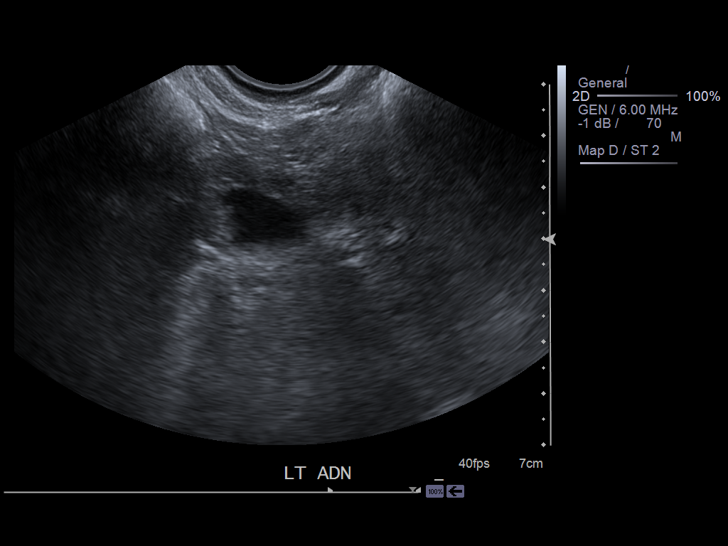
[im 56/84]
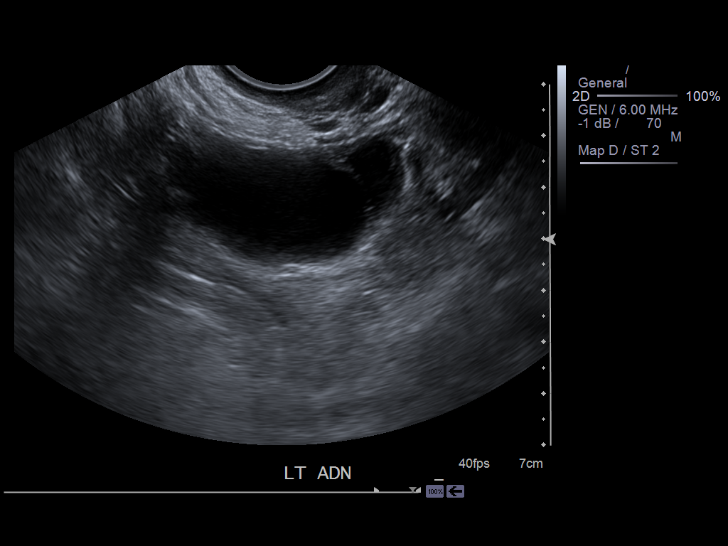
[im 63/84]
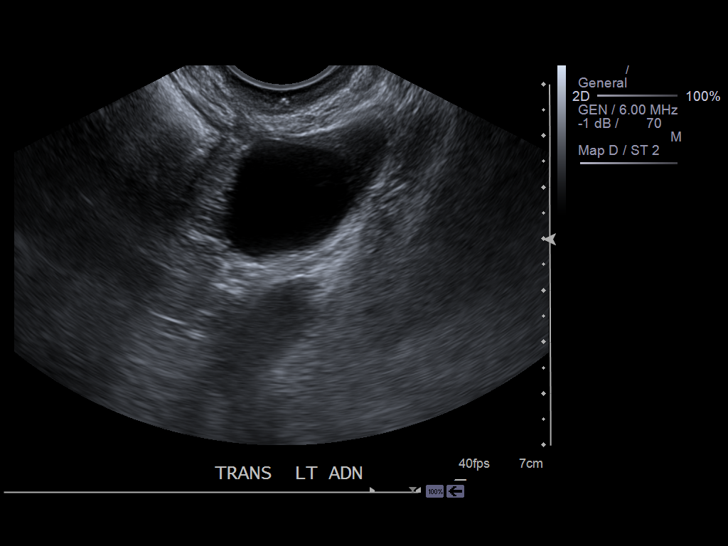
[im 70/84]
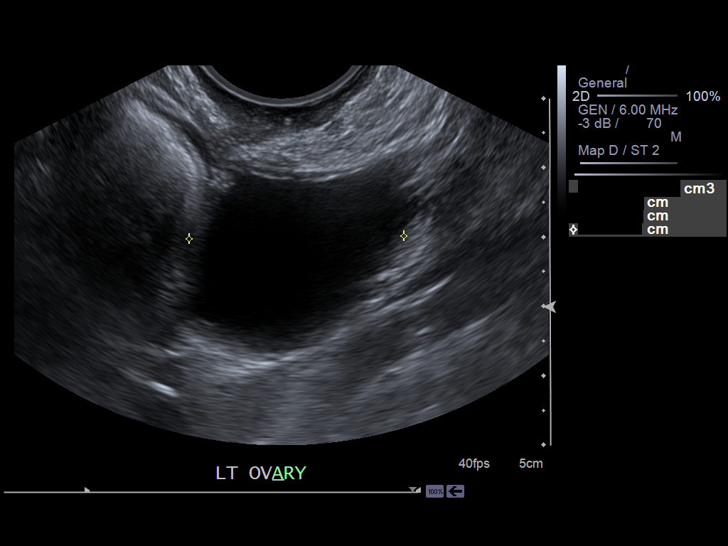
[im 77/84]
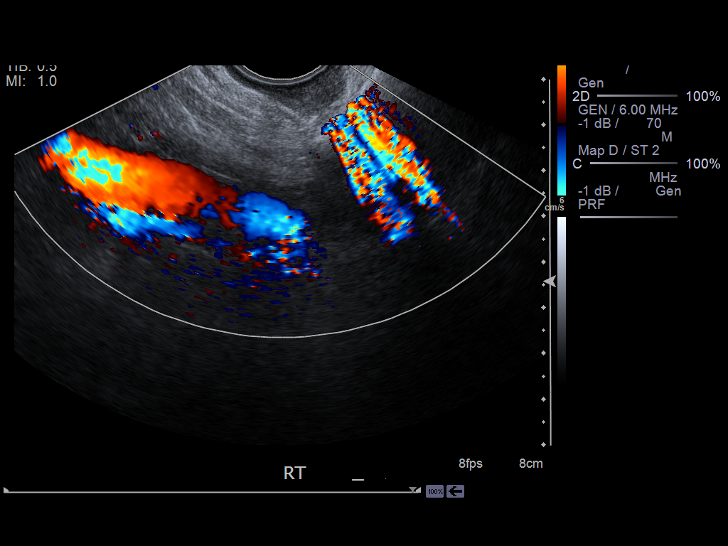
[im 84/84]
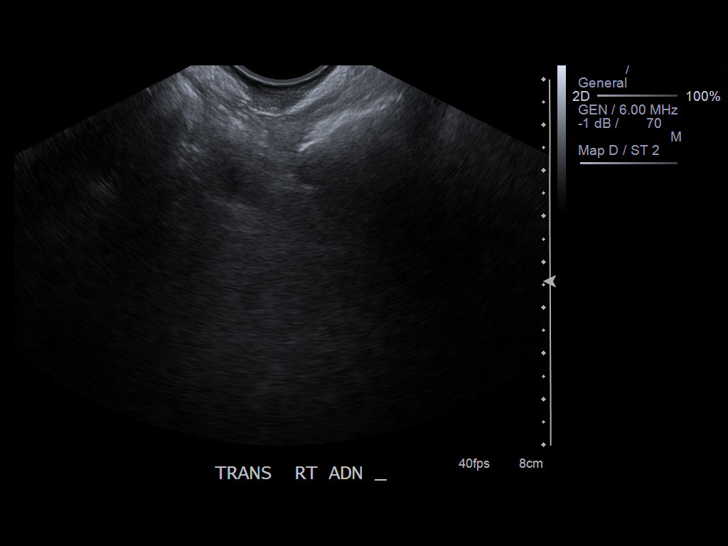

[14 of 25 positions shown; findings below may reference images not displayed]

PROCEDURE:     TARLA - TARLA PELVIS NON-OB W/TRANSVAGINAL  - October 05, 2013 [DATE]

RESULT:     The patient has a history of hysterectomy. The right ovary is
not seen. The left ovary measures 4.25 x 2.47 x 3.10 cm. The patient's
kidneys show no definite obstruction on survey images. The left ovary is
best appreciated on endovaginal scanning and contains adjacent or septated
cysts. This septated cystic area measures 3.92 x 2.58 x 2.64 cm. Is
predominantly replaces a large portion of the normal ovarian tissue.
IMPRESSION: The previous ultrasound on 06/16/2012 showed a septated large
left ovarian cystic mass which persists. Gynecologic consideration for
removal is suggested. The right ovary is not seen. The uterus has been
removed.

[REDACTED]

## 2014-04-19 ENCOUNTER — Ambulatory Visit: Payer: Self-pay | Admitting: Family Medicine

## 2014-04-19 LAB — HM MAMMOGRAPHY: HM Mammogram: NORMAL

## 2014-04-20 ENCOUNTER — Encounter: Payer: Self-pay | Admitting: *Deleted

## 2014-05-12 ENCOUNTER — Encounter (INDEPENDENT_AMBULATORY_CARE_PROVIDER_SITE_OTHER): Payer: Self-pay

## 2014-05-12 ENCOUNTER — Encounter: Payer: Self-pay | Admitting: Internal Medicine

## 2014-05-12 ENCOUNTER — Ambulatory Visit (INDEPENDENT_AMBULATORY_CARE_PROVIDER_SITE_OTHER): Payer: BC Managed Care – PPO | Admitting: Internal Medicine

## 2014-05-12 VITALS — BP 138/88 | HR 84 | Temp 98.2°F | Ht 62.0 in | Wt 160.2 lb

## 2014-05-12 DIAGNOSIS — G510 Bell's palsy: Secondary | ICD-10-CM | POA: Insufficient documentation

## 2014-05-12 MED ORDER — VALACYCLOVIR HCL 1 G PO TABS: 1000.0000 mg | ORAL_TABLET | Freq: Three times a day (TID) | ORAL | Status: DC

## 2014-05-12 NOTE — Progress Notes (Signed)
Pre visit review using our clinic review tool, if applicable. No additional management support is needed unless otherwise documented below in the visit note. 

## 2014-05-12 NOTE — Assessment & Plan Note (Signed)
Symptoms and exam consistent with Bell's Palsy. Discussed this disease with pt. Will continue Prednisone as prescribed by NP at her work. Will increase dose of Valtrex to 1gm po tid x 7 days. Encouraged her to take precautions to keep her eye moist with artificial tears. Follow up in 4 weeks and prn.

## 2014-05-12 NOTE — Progress Notes (Signed)
   Subjective:    Patient ID: [name], female    DOB: 10-11-1966, 48 y.o.   MRN: [id number]  HPI 48YO female presents for acute visit.  Monday morning woke up with left sided facial paralysis. No other neurological symptoms. Was evaluated by Jonita Albee NP at her work, who started Prednisone and Valtrex.  Review of Systems  Constitutional: Negative for fever, chills and fatigue.  Respiratory: Negative for shortness of breath.   Cardiovascular: Negative for chest pain.  Neurological: Positive for facial asymmetry. Negative for dizziness, tremors, seizures, syncope, speech difficulty, light-headedness, numbness and headaches.  Psychiatric/Behavioral: Negative for confusion and sleep disturbance. The patient is not nervous/anxious.        Objective:    BP 138/88  Pulse 84  Temp(Src) 98.2 F (36.8 C) (Oral)  Ht 5\' 2"  (1.575 m)  Wt 160 lb 4 oz (72.689 kg)  BMI 29.30 kg/m2  SpO2 95% Physical Exam  Constitutional: She is oriented to person, place, and time. She appears well-developed and well-nourished. No distress.  HENT:  Head: Normocephalic and atraumatic.  Right Ear: External ear normal.  Left Ear: External ear normal.  Nose: Nose normal.  Mouth/Throat: Oropharynx is clear and moist. No oropharyngeal exudate.  Eyes: Conjunctivae are normal. Pupils are equal, round, and reactive to light. Right eye exhibits no discharge. Left eye exhibits no discharge. No scleral icterus.  Neck: Normal range of motion. Neck supple. No tracheal deviation present. No thyromegaly present.  Cardiovascular: Normal rate, regular rhythm, normal heart sounds and intact distal pulses.  Exam reveals no gallop and no friction rub.   No murmur heard. Pulmonary/Chest: Effort normal and breath sounds normal. No accessory muscle usage. Not tachypneic. No respiratory distress. She has no decreased breath sounds. She has no wheezes. She has no rhonchi. She has no rales. She exhibits no tenderness.    Musculoskeletal: Normal range of motion. She exhibits no edema and no tenderness.  Lymphadenopathy:    She has no cervical adenopathy.  Neurological: She is alert and oriented to person, place, and time. No sensory deficit. She exhibits normal muscle tone. Coordination normal.  Left sided facial paralysis.  Skin: Skin is warm and dry. No rash noted. She is not diaphoretic. No erythema. No pallor.  Psychiatric: She has a normal mood and affect. Her behavior is normal. Judgment and thought content normal.          Assessment & Plan:   Problem List Items Addressed This Visit     Unprioritized   Bell's palsy - Primary     Symptoms and exam consistent with Bell's Palsy. Discussed this disease with pt. Will continue Prednisone as prescribed by NP at her work. Will increase dose of Valtrex to 1gm po tid x 7 days. Encouraged her to take precautions to keep her eye moist with artificial tears. Follow up in 4 weeks and prn.    Relevant Medications      valACYclovir (VALTREX) tablet       Return in about 4 weeks (around 06/09/2014).

## 2014-05-12 NOTE — Patient Instructions (Signed)
Bell's Palsy  Bell's palsy is a condition in which the muscles on one side of the face cannot move (paralysis). This is because the nerves in the face are paralyzed. It is most often thought to be caused by a virus. The virus causes swelling of the nerve that controls movement on one side of the face. The nerve travels through a tight space surrounded by bone. When the nerve swells, it can be compressed by the bone. This results in damage to the protective covering around the nerve. This damage interferes with how the nerve communicates with the muscles of the face. As a result, it can cause weakness or paralysis of the facial muscles.   Injury (trauma), tumor, and surgery may cause Bell's palsy, but most of the time the cause is unknown. It is a relatively common condition. It starts suddenly (abrupt onset) with the paralysis usually ending within 2 days. Bell's palsy is not dangerous. But because the eye does not close properly, you may need care to keep the eye from getting dry. This can include splinting (to keep the eye shut) or moistening with artificial tears. Bell's palsy very seldom occurs on both sides of the face at the same time.  SYMPTOMS    Eyebrow sagging.   Drooping of the eyelid and corner of the mouth.   Inability to close one eye.   Loss of taste on the front of the tongue.   Sensitivity to loud noises.  TREATMENT   The treatment is usually non-surgical. If the patient is seen within the first 24 to 48 hours, a short course of steroids may be prescribed, in an attempt to shorten the length of the condition. Antiviral medicines may also be used with the steroids, but it is unclear if they are helpful.   You will need to protect your eye, if you cannot close it. The cornea (clear covering over your eye) will become dry and can be damaged. Artificial tears can be used to keep your eye moist. Glasses or an eye patch should be worn to protect your eye.  PROGNOSIS   Recovery is variable, ranging  from days to months. Although the problem usually goes away completely (about 80% of cases resolve), predicting the outcome is impossible. Most people improve within 3 weeks of when the symptoms began. Improvement may continue for 3 to 6 months. A small number of people have moderate to severe weakness that is permanent.   HOME CARE INSTRUCTIONS    If your caregiver prescribed medication to reduce swelling in the nerve, use as directed. Do not stop taking the medication unless directed by your caregiver.   Use moisturizing eye drops as needed to prevent drying of your eye, as directed by your caregiver.   Protect your eye, as directed by your caregiver.   Use facial massage and exercises, as directed by your caregiver.   Perform your normal activities, and get your normal rest.  SEEK IMMEDIATE MEDICAL CARE IF:    There is pain, redness or irritation in the eye.   You or your child has an oral temperature above 102 F (38.9 C), not controlled by medicine.  MAKE SURE YOU:    Understand these instructions.   Will watch your condition.   Will get help right away if you are not doing well or get worse.  Document Released: 12/02/2005 Document Revised: 02/24/2012 Document Reviewed: 12/11/2009  Exit[employer] Patient Information 2014 [employer].

## 2014-05-18 ENCOUNTER — Ambulatory Visit: Payer: Self-pay | Admitting: Obstetrics and Gynecology

## 2014-05-18 ENCOUNTER — Telehealth: Payer: Self-pay | Admitting: *Deleted

## 2014-05-18 LAB — HEMOGLOBIN: HGB: 13.9 g/dL (ref 12.0–16.0)

## 2014-05-18 NOTE — Telephone Encounter (Signed)
[facility] Pre-Op nurse called stating that pt's blood pressure today was 182/116 (right arm) & 169/100 (left arm).  Pt is having surgery on Monday and Pre-Op is requesting pt be put on BP medication.  Scheduled pt an appt to be seen tomorrow.

## 2014-05-19 ENCOUNTER — Ambulatory Visit (INDEPENDENT_AMBULATORY_CARE_PROVIDER_SITE_OTHER): Payer: BC Managed Care – PPO | Admitting: Internal Medicine

## 2014-05-19 ENCOUNTER — Encounter: Payer: Self-pay | Admitting: Internal Medicine

## 2014-05-19 VITALS — BP 142/80 | HR 89 | Temp 98.5°F | Ht 62.0 in | Wt 161.5 lb

## 2014-05-19 DIAGNOSIS — I10 Essential (primary) hypertension: Secondary | ICD-10-CM

## 2014-05-19 MED ORDER — AMLODIPINE BESYLATE 2.5 MG PO TABS: 2.5000 mg | ORAL_TABLET | Freq: Every day | ORAL | Status: DC

## 2014-05-19 NOTE — Assessment & Plan Note (Signed)
BP Readings from Last 3 Encounters:  05/19/14 142/80  05/12/14 138/88  09/13/13 150/96   BP elevated at recent preop check. Likely related to recent use of prednisone, however BP has been elevated in the past. Will start Amlodipine 2.5mg  daily. Check BP with NP at work. Follow up in 3 weeks as scheduled and prn.

## 2014-05-19 NOTE — Progress Notes (Signed)
Subjective:    Patient ID: [name], female    DOB: 31-May-1966, 48 y.o.   MRN: [id number]  HPI 48YO female presents for acute visit.  HTN - BP was noted to be elevated 182/100 and 169/100 at [facility] for preop for oophorectomy. No h/o HTN. No chest pain or headache. Recently on prednisone for Bell's Palsy, finishes tomorrow.  Review of Systems  Constitutional: Negative for fever, chills, appetite change, fatigue and unexpected weight change.  HENT: Negative for congestion, ear pain, sinus pressure, sore throat, trouble swallowing and voice change.   Eyes: Negative for visual disturbance.  Respiratory: Negative for cough, shortness of breath, wheezing and stridor.   Cardiovascular: Negative for chest pain, palpitations and leg swelling.  Gastrointestinal: Negative for nausea, vomiting, abdominal pain, diarrhea, constipation, blood in stool, abdominal distention and anal bleeding.  Genitourinary: Negative for dysuria and flank pain.  Musculoskeletal: Negative for arthralgias, gait problem, myalgias and neck pain.  Skin: Negative for color change and rash.  Neurological: Positive for facial asymmetry (left facial paralysis). Negative for dizziness and headaches.  Hematological: Negative for adenopathy. Does not bruise/bleed easily.  Psychiatric/Behavioral: Negative for suicidal ideas, sleep disturbance and dysphoric mood. The patient is not nervous/anxious.        Objective:    BP 142/80  Pulse 89  Temp(Src) 98.5 F (36.9 C) (Oral)  Ht 5\' 2"  (1.575 m)  Wt 161 lb 8 oz (73.256 kg)  BMI 29.53 kg/m2  SpO2 96% Physical Exam  Constitutional: She is oriented to person, place, and time. She appears well-developed and well-nourished. No distress.  HENT:  Head: Normocephalic and atraumatic.  Right Ear: External ear normal.  Left Ear: External ear normal.  Nose: Nose normal.  Mouth/Throat: Oropharynx is clear and moist. No oropharyngeal exudate.  Eyes: Conjunctivae are normal. Pupils  are equal, round, and reactive to light. Right eye exhibits no discharge. Left eye exhibits no discharge. No scleral icterus.  Neck: Normal range of motion. Neck supple. No tracheal deviation present. No thyromegaly present.  Cardiovascular: Normal rate, regular rhythm, normal heart sounds and intact distal pulses.  Exam reveals no gallop and no friction rub.   No murmur heard. Pulmonary/Chest: Effort normal and breath sounds normal. No accessory muscle usage. Not tachypneic. No respiratory distress. She has no decreased breath sounds. She has no wheezes. She has no rhonchi. She has no rales. She exhibits no tenderness.  Musculoskeletal: Normal range of motion. She exhibits no edema and no tenderness.  Lymphadenopathy:    She has no cervical adenopathy.  Neurological: She is alert and oriented to person, place, and time. No cranial nerve deficit. She exhibits normal muscle tone. Coordination normal.  Left sided facial paralysis c/w known Bell's Palsy  Skin: Skin is warm and dry. No rash noted. She is not diaphoretic. No erythema. No pallor.  Psychiatric: She has a normal mood and affect. Her behavior is normal. Judgment and thought content normal.          Assessment & Plan:   Problem List Items Addressed This Visit     Unprioritized   Essential hypertension, benign - Primary      BP Readings from Last 3 Encounters:  05/19/14 142/80  05/12/14 138/88  09/13/13 150/96   BP elevated at recent preop check. Likely related to recent use of prednisone, however BP has been elevated in the past. Will start Amlodipine 2.5mg  daily. Check BP with NP at work. Follow up in 3 weeks as scheduled and prn.  Relevant Medications      amLODIpine (NORVASC)  tablet       Return if symptoms worsen or fail to improve.

## 2014-05-19 NOTE — Progress Notes (Signed)
Pre visit review using our clinic review tool, if applicable. No additional management support is needed unless otherwise documented below in the visit note. 

## 2014-05-19 NOTE — Patient Instructions (Signed)
Start Amlodipine 2.5mg  daily to help control blood pressure while on prednisone.  Have Tammy monitor BP daily at work.  Call if readings >150/90.  Follow up as scheduled in 3 weeks or sooner if needed.

## 2014-05-20 ENCOUNTER — Telehealth: Payer: Self-pay | Admitting: Internal Medicine

## 2014-05-20 NOTE — Telephone Encounter (Signed)
Relevant patient education assigned to patient using Emmi. ° °

## 2014-05-23 ENCOUNTER — Ambulatory Visit: Payer: Self-pay | Admitting: Obstetrics and Gynecology

## 2014-05-25 LAB — PATHOLOGY REPORT

## 2014-06-23 ENCOUNTER — Ambulatory Visit: Payer: BC Managed Care – PPO | Admitting: Internal Medicine

## 2014-07-15 ENCOUNTER — Inpatient Hospital Stay: Payer: Self-pay | Admitting: Internal Medicine

## 2014-07-15 LAB — COMPREHENSIVE METABOLIC PANEL
ALBUMIN: 3.8 g/dL (ref 3.4–5.0)
Alkaline Phosphatase: 76 U/L
Anion Gap: 4 — ABNORMAL LOW (ref 7–16)
BUN: 7 mg/dL (ref 7–18)
Bilirubin,Total: 0.6 mg/dL (ref 0.2–1.0)
CALCIUM: 9 mg/dL (ref 8.5–10.1)
CO2: 26 mmol/L (ref 21–32)
Chloride: 112 mmol/L — ABNORMAL HIGH (ref 98–107)
Creatinine: 0.64 mg/dL (ref 0.60–1.30)
EGFR (African American): >60
GLUCOSE: 95 mg/dL (ref 65–99)
OSMOLALITY: 281 (ref 275–301)
POTASSIUM: 3.6 mmol/L (ref 3.5–5.1)
SGOT(AST): 47 U/L — ABNORMAL HIGH (ref 15–37)
SGPT (ALT): 24 U/L
Sodium: 142 mmol/L (ref 136–145)
Total Protein: 7.4 g/dL (ref 6.4–8.2)

## 2014-07-15 LAB — CBC
HCT: 43.9 % (ref 35.0–47.0)
HGB: 14.6 g/dL (ref 12.0–16.0)
MCH: 30.8 pg (ref 26.0–34.0)
MCHC: 33.4 g/dL (ref 32.0–36.0)
MCV: 92 fL (ref 80–100)
Platelet: 235 10*3/uL (ref 150–440)
RBC: 4.76 10*6/uL (ref 3.80–5.20)
RDW: 14.3 % (ref 11.5–14.5)
WBC: 13.4 10*3/uL — AB (ref 3.6–11.0)

## 2014-07-15 LAB — TROPONIN I
TROPONIN-I: 6.1 ng/mL — AB
TROPONIN-I: 7.8 ng/mL — AB
Troponin-I: 4 ng/mL — ABNORMAL HIGH

## 2014-07-15 LAB — CK TOTAL AND CKMB (NOT AT ARMC)
CK, TOTAL: 588 U/L — AB
CK, Total: 593 U/L — ABNORMAL HIGH
CK-MB: 58.6 ng/mL — ABNORMAL HIGH (ref 0.5–3.6)

## 2014-07-15 LAB — HEPARIN LEVEL (UNFRACTIONATED): Anti-Xa(Unfractionated): <0.1 IU/mL — ABNORMAL LOW (ref 0.30–0.70)

## 2014-07-15 LAB — PROTIME-INR
INR: 1
Prothrombin Time: 13.4 secs (ref 11.5–14.7)

## 2014-07-15 LAB — CK-MB
CK-MB: 48.4 ng/mL — AB (ref 0.5–3.6)
CK-MB: 55.4 ng/mL — ABNORMAL HIGH (ref 0.5–3.6)

## 2014-07-15 LAB — APTT: ACTIVATED PTT: 34.6 s (ref 23.6–35.9)

## 2014-07-16 LAB — CBC WITH DIFFERENTIAL/PLATELET
BASOS ABS: 0.1 10*3/uL (ref 0.0–0.1)
BASOS PCT: 0.8 %
EOS ABS: 0.3 10*3/uL (ref 0.0–0.7)
Eosinophil %: 2.4 %
HCT: 40.8 % (ref 35.0–47.0)
HGB: 13.3 g/dL (ref 12.0–16.0)
LYMPHS ABS: 3.4 10*3/uL (ref 1.0–3.6)
LYMPHS PCT: 30 %
MCH: 30.6 pg (ref 26.0–34.0)
MCHC: 32.5 g/dL (ref 32.0–36.0)
MCV: 94 fL (ref 80–100)
MONO ABS: 0.8 x10 3/mm (ref 0.2–0.9)
MONOS PCT: 7.2 %
NEUTROS ABS: 6.8 10*3/uL — AB (ref 1.4–6.5)
Neutrophil %: 59.6 %
Platelet: 214 10*3/uL (ref 150–440)
RBC: 4.34 10*6/uL (ref 3.80–5.20)
RDW: 14.2 % (ref 11.5–14.5)
WBC: 11.3 10*3/uL — ABNORMAL HIGH (ref 3.6–11.0)

## 2014-07-16 LAB — COMPREHENSIVE METABOLIC PANEL
ALK PHOS: 66 U/L
ALT: 24 U/L
AST: 55 U/L — AB (ref 15–37)
Albumin: 3.3 g/dL — ABNORMAL LOW (ref 3.4–5.0)
Anion Gap: 6 — ABNORMAL LOW (ref 7–16)
BILIRUBIN TOTAL: 0.4 mg/dL (ref 0.2–1.0)
BUN: 8 mg/dL (ref 7–18)
CHLORIDE: 110 mmol/L — AB (ref 98–107)
CO2: 26 mmol/L (ref 21–32)
Calcium, Total: 8.6 mg/dL (ref 8.5–10.1)
Creatinine: 0.73 mg/dL (ref 0.60–1.30)
EGFR (African American): >60
Glucose: 96 mg/dL (ref 65–99)
Osmolality: 281 (ref 275–301)
POTASSIUM: 3.6 mmol/L (ref 3.5–5.1)
Sodium: 142 mmol/L (ref 136–145)
Total Protein: 6.4 g/dL (ref 6.4–8.2)

## 2014-07-16 LAB — LIPID PANEL
CHOLESTEROL: 162 mg/dL (ref 0–200)
HDL: 27 mg/dL — AB (ref 40–60)
LDL CHOLESTEROL, CALC: 86 mg/dL (ref 0–100)
Triglycerides: 243 mg/dL — ABNORMAL HIGH (ref 0–200)
VLDL CHOLESTEROL, CALC: 49 mg/dL — AB (ref 5–40)

## 2014-07-16 LAB — TSH: Thyroid Stimulating Horm: 0.996 u[IU]/mL

## 2014-09-07 ENCOUNTER — Ambulatory Visit (INDEPENDENT_AMBULATORY_CARE_PROVIDER_SITE_OTHER): Payer: BC Managed Care – PPO | Admitting: Internal Medicine

## 2014-09-07 ENCOUNTER — Encounter: Payer: Self-pay | Admitting: Internal Medicine

## 2014-09-07 VITALS — BP 124/80 | HR 80 | Temp 98.2°F | Ht 61.5 in | Wt 149.2 lb

## 2014-09-07 DIAGNOSIS — F17200 Nicotine dependence, unspecified, uncomplicated: Secondary | ICD-10-CM

## 2014-09-07 DIAGNOSIS — I10 Essential (primary) hypertension: Secondary | ICD-10-CM

## 2014-09-07 DIAGNOSIS — I2510 Atherosclerotic heart disease of native coronary artery without angina pectoris: Secondary | ICD-10-CM

## 2014-09-07 DIAGNOSIS — Z0000 Encounter for general adult medical examination without abnormal findings: Secondary | ICD-10-CM

## 2014-09-07 NOTE — Assessment & Plan Note (Signed)
BP Readings from Last 3 Encounters:  09/07/14 124/80  05/19/14 142/80  05/12/14 138/88   BP well controlled on current medications. Will get recent records from [facility] and her cardiologist.

## 2014-09-07 NOTE — Progress Notes (Signed)
Pre visit review using our clinic review tool, if applicable. No additional management support is needed unless otherwise documented below in the visit note. 

## 2014-09-07 NOTE — Patient Instructions (Signed)

## 2014-09-07 NOTE — Assessment & Plan Note (Signed)
General medical exam including breast exam normal today. PAP and pelvic deferred as PAP normal 2014, HPV neg and pt s/p hysterectomy and oophorectomy. Mammogram UTD and reviewed. Colonoscopy scheduled for this week. Flu vaccine will be obtained through her work. Encouraged smoking cessation. Encouraged healthy diet and exercise. Reviewed recent labs which showed normal CBC, CMP, lipids.

## 2014-09-07 NOTE — Assessment & Plan Note (Signed)
Encouraged smoking cessation. Gave info on 1-800-QUIT-NOW

## 2014-09-07 NOTE — Progress Notes (Signed)
Subjective:    Patient ID: [name], female    DOB: 1966/02/27, 48 y.o.   MRN: [id number]  HPI 47YO female presents for annual exam.  Heart attack in 07/2014. Developed symptoms of "heart burn." Went to see NP at work, BP was elevated, sent to ED. No intervention made. Had cardiac cath with Dr. Welton Flakes. She has been cutting back on smoking. Feeling well with no chest pain or or dyspnea.  Also recently had oophorectomy. No problems noted after surgery.  No other new concerns today. Trying to follow a healthy diet and stay active.  Review of Systems  Constitutional: Negative for fever, chills, appetite change, fatigue and unexpected weight change.  Eyes: Negative for visual disturbance.  Respiratory: Negative for shortness of breath.   Cardiovascular: Negative for chest pain and leg swelling.  Gastrointestinal: Negative for nausea, vomiting, abdominal pain, diarrhea and constipation.  Genitourinary: Negative for vaginal bleeding, vaginal discharge, vaginal pain and pelvic pain.  Musculoskeletal: Negative for arthralgias and myalgias.  Skin: Negative for color change and rash.  Neurological: Negative for weakness.  Hematological: Negative for adenopathy. Does not bruise/bleed easily.  Psychiatric/Behavioral: Negative for dysphoric mood. The patient is not nervous/anxious.        Objective:    BP 124/80  Pulse 80  Temp(Src) 98.2 F (36.8 C) (Oral)  Ht 5' 1.5" (1.562 m)  Wt 149 lb 4 oz (67.699 kg)  BMI 27.75 kg/m2  SpO2 97% Physical Exam  Constitutional: She is oriented to person, place, and time. She appears well-developed and well-nourished. No distress.  HENT:  Head: Normocephalic and atraumatic.  Right Ear: External ear normal.  Left Ear: External ear normal.  Nose: Nose normal.  Mouth/Throat: Oropharynx is clear and moist. No oropharyngeal exudate.  Eyes: Conjunctivae are normal. Pupils are equal, round, and reactive to light. Right eye exhibits no discharge. Left  eye exhibits no discharge. No scleral icterus.  Neck: Normal range of motion. Neck supple. No tracheal deviation present. No thyromegaly present.  Cardiovascular: Normal rate, regular rhythm, normal heart sounds and intact distal pulses.  Exam reveals no gallop and no friction rub.   No murmur heard. Pulmonary/Chest: Effort normal and breath sounds normal. No accessory muscle usage. Not tachypneic. No respiratory distress. She has no decreased breath sounds. She has no wheezes. She has no rhonchi. She has no rales. She exhibits no tenderness. Right breast exhibits no inverted nipple, no mass, no nipple discharge, no skin change and no tenderness. Left breast exhibits no inverted nipple, no mass, no nipple discharge, no skin change and no tenderness. Breasts are symmetrical.  Abdominal: Soft. Bowel sounds are normal. She exhibits no distension and no mass. There is no tenderness. There is no rebound and no guarding.  Musculoskeletal: Normal range of motion. She exhibits no edema and no tenderness.  Lymphadenopathy:    She has no cervical adenopathy.  Neurological: She is alert and oriented to person, place, and time. No cranial nerve deficit. She exhibits normal muscle tone. Coordination normal.  Skin: Skin is warm and dry. No rash noted. She is not diaphoretic. No erythema. No pallor.  Psychiatric: She has a normal mood and affect. Her behavior is normal. Judgment and thought content normal.          Assessment & Plan:   Problem List Items Addressed This Visit     Unprioritized   CAD (coronary artery disease)     Symptomatically doing well. Will get notes from [facility] and her cardiologist.  Relevant Medications      aspirin EC 81 MG tablet   Essential hypertension, benign      BP Readings from Last 3 Encounters:  09/07/14 124/80  05/19/14 142/80  05/12/14 138/88   BP well controlled on current medications. Will get recent records from [facility] and her cardiologist.    Relevant  Medications      aspirin EC 81 MG tablet   Routine general medical examination at a health care facility - Primary     General medical exam including breast exam normal today. PAP and pelvic deferred as PAP normal 2014, HPV neg and pt s/p hysterectomy and oophorectomy. Mammogram UTD and reviewed. Colonoscopy scheduled for this week. Flu vaccine will be obtained through her work. Encouraged smoking cessation. Encouraged healthy diet and exercise. Reviewed recent labs which showed normal CBC, CMP, lipids.    Tobacco use disorder     Encouraged smoking cessation. Gave info on 1-800-QUIT-NOW        Return in about 6 months (around 03/08/2015) for Recheck.

## 2014-09-07 NOTE — Assessment & Plan Note (Signed)
Symptomatically doing well. Will get notes from [facility] and her cardiologist.

## 2014-09-15 ENCOUNTER — Encounter: Payer: BC Managed Care – PPO | Admitting: Internal Medicine

## 2015-03-09 ENCOUNTER — Ambulatory Visit: Payer: BC Managed Care – PPO | Admitting: Internal Medicine

## 2015-04-05 ENCOUNTER — Ambulatory Visit
Admit: 2015-04-05 | Disposition: A | Discharge status: Home / Self Care | Payer: Self-pay | Attending: Obstetrics and Gynecology | Admitting: Obstetrics and Gynecology

## 2015-04-08 NOTE — Op Note (Signed)
PATIENT NAME:  [name], [name] MR#:  [id number] DATE OF BIRTH:  Nov 02, 1966  DATE OF PROCEDURE:  05/23/2014  PREOPERATIVE DIAGNOSIS: Left ovarian cyst   POSTOPERATIVE DIAGNOSIS: Left ovarian cyst.   PROCEDURE: Laparoscopic bilateral salpingo-oophorectomy.   ANESTHESIA: General endotracheal.   SURGEON: Dr. Hildred LaserAnika Linnell Swords.  ASSISTANT: Dr.  Sharon SellerMartin DeFrancesco, M.D. and Alfredo MartinezJustin Ollis , [employer] student.   ESTIMATED BLOOD LOSS: Minimal.   OPERATIVE FLUIDS: 500 ml. Urine output 100 mL.  COMPLICATIONS: None.   FINDINGS: Large left adnexal cyst, approximately 5 cm. There is a small simple -appearing right adnexal cyst. Filmy adhesions of the bowel to the left pelvic sidewall were noted near the left adnexa. Normal-appearing fallopian tubes bilaterally.   PROCEDURE: The patient was taken to the operating room where she was placed under general anesthesia without difficulty. She was then prepped and draped in normal sterile fashion and placed in the dorsal lithotomy position. A straight catheterization was performed. Next, a sponge stick was then placed in the patient's vagina for vaginal cuff manipulation.   Attention was then turned to the abdomen where a 5 mm vertical infraumbilical skin incision was made. The laparoscope inside of the 5 mm trocar with sheath was then inserted into the abdominal cavity under direct visualization.  Entrance into the abdominal cavity was confirmed, and the abdomen was then insufflated with CO2. After this, a survey of the abdomen was performed with the above findings noted. Next, a 5 mm skin incision was made lateral to the left rectus abdominis muscle for a 5 mm lateral laparoscopic port site under direct visualization. The same procedure was then carried out on the right side using an 11 mm port.  There were some filmy adhesions of the bowel to the left pelvic sidewall. These adhesions were taken down using the Harmonic device.  After this, the left adnexa was then elevated  using an atraumatic grasper and the infundibulopelvic ligament was then transected and ligated using the Harmonic  ligature device. Several bites were taken with good hemostasis noted until the ovary and fallopian tube were free.    Attention was then turned to the right adnexa, which was in a similar fashion grasped and tented up and transected and ligated using the Harmonic device. Once the right ovary was freed, both the left and the right ovaries were then placed into an Endo Catch bag and removed through the left port incision. The specimens were both removed intact. After this, desufflation of the abdomen was allowed to take place with removal of  the trocars under direct visualization. All areas were noted to be hemostatic at conclusion of the case.   The fascia of the left laparoscopic incision was then repaired using a figure-of-eight suture of 0 Vicryl on a UR-6 needle. The skin was then closed with 3-0 Vicryl in a subcuticular fashion. All incisions were then injected with a total of 10 mL of 0.5 Sensorcaine with epinephrine.Dermabond was then placed over all three incisions.  The patient tolerated the procedure well.  She was then taken to the PACU in stable condition.  Sponge, lap, and needle counts were correct x 2.    ____________________________ Jacques EarthlyAnika S. Valentino Saxonherry, MD asc:dd D: 05/23/2014 20:18:11 ET T: 05/24/2014 03:43:12 ET JOB#: [id number]  cc: Jacques EarthlyAnika S. Valentino Saxonherry, MD, <Dictator> Fabian NovemberANIKA S Jenevie Casstevens MD ELECTRONICALLY SIGNED 05/30/2014 10:11

## 2015-04-08 NOTE — H&P (Signed)
PATIENT NAME:  Jennifer, Contreras MR#:  [id number] DATE OF BIRTH:  02-22-1966  DATE OF ADMISSION:  07/15/2014  PRIMARY CARE PHYSICIAN: Dr. Ronette Deter  CHIEF COMPLAINT: Chest pain.   HISTORY OF PRESENT ILLNESS: This is a very pleasant 49 year old female with a history of tobacco dependence, hypertension and hypothyroidism who presents with the above complaint. The patient says yesterday she started developing chest pain, about an 8 out of 10, associated with some nausea and some pain in both of her arms, lasted about 5 or 10 minutes, subsided on its own, and then again today, while she was at work, she had similar type pain without the associated symptoms. She asked the nurse at work to check her blood pressure. It was high so she came here for further evaluation. In the ER, she was noted to have a troponin of 4.  REVIEW OF SYSTEMS: CONSTITUTIONAL: No fever, fatigue or weakness. EYES: No blurred or double vision. No glaucoma.  ENT: No ear pain, hearing loss, seasonal allergies. RESPIRATORY: No cough, wheezing, hemoptysis. CARDIOVASCULAR: Positive chest pain. No palpitations, orthopnea, syncope edema. GASTROINTESTINAL: No nausea, vomiting, diarrhea, abdominal pain, melena or ulcers. GENITOURINARY: No dysuria or hematuria. ENDOCRINE: No polyuria or polydipsia. HEMATOLOGIC AND LYMPHATIC: No anemia or easy bruising. SKIN: No rash or lesions.  MUSCULOSKELETAL: No limited activity. NEUROLOGIC: No history of CVA or TIA or seizure.  PSYCHIATRIC: No history of anxiety or depression.  PAST MEDICAL HISTORY: 1.  Tobacco dependence.  2.  Hypertension.  3.  Hypothyroidism.   MEDICATIONS: 1.  Aleve 220 mg 2 tablets daily.  2.  Cod liver oil daily.  3.  Synthroid 50 mcg daily.  4.  Lisinopril 10 mg daily. 5.  Nexium 20 mg daily.  ALLERGIES: No known drug allergies.   SOCIAL HISTORY: The patient smokes 10 cigarettes a day. No alcohol or drug use.  FAMILY HISTORY No history of hypertension or  diabetes.   PAST SURGICAL HISTORY: Recent oophorectomy.  PHYSICAL EXAMINATION:  VITAL SIGNS: Temperature 98, pulse 90, respirations 20, blood pressure 115/89, 99% on room air.  GENERAL: The patient is alert and oriented, not in acute distress. HEENT: Head is atraumatic. Pupils are reactive. Sclerae anicteric. Mucous membranes are moist. Oropharynx is clear.  NECK: Supple without JVD, carotid bruit, or enlarged thyroid. CARDIOVASCULAR: Regular rate and rhythm. No murmurs, gallops, or rubs. PMI is not displaced.  LUNGS: Clear to auscultation without crackles, rales, rhonchi or wheezing. Normal to percussion. ABDOMEN: Bowel sounds are positive. Nontender, nondistended. No hepatosplenomegaly.  EXTREMITIES: No clubbing, cyanosis or edema.  NEUROLOGIC: Cranial nerves II through XII are grossly intact. No focal deficits. Strength 5/5 in all extremities.   DIAGNOSTIC DATA: White blood cells 13, hemoglobin 14.6, hematocrit 43.9, platelets 235,000. Troponin I 4.00. CPK-MB 48.4.   Sodium 142, potassium 3.6, chloride 112, bicarb 26, BUN 7, creatinine 0.64, glucose 95. Bilirubin 0.6, calcium 9, alk phos 76, ALT 24, AST 47, total protein 7.4.   Chest x-ray shows no acute cardiopulmonary disease.   EKG shows no ST elevations or depressions. Cannot rule out acute infarct.   ASSESSMENT AND PLAN: A 49 year old female who presents with chest pain and found to have non-ST-elevation myocardial infarction. 1.  Non-ST-Elevation myocardial infarction. I have spoken with Dr. Neoma Laming. The patient has not eaten and drank early this morning. She may be able to go for cardiac catheterization. I have put in cardiac catheterization orders. As per Dr. Humphrey Rolls, the patient is on Lovenox, which will be held for  now. She was started on aspirin, nitroglycerin, beta blocker and statin medications. Lipid panel will be ordered for tomorrow. Continue to cycle her troponins. 2.  Tobacco dependence. The patient was counseled for 3  minutes regarding smoking. She has not thought about quitting smoking, but given this event she says she will try to quit smoking.  3.  History of hypertension. The patient has low-normal blood pressure. For now I am just going to suspend her lisinopril because I have ordered Coreg 3.125 b.i.d. due to problem #1.  4.  Hypothyroidism. Will check a TSH. Continue her on Levothyroxine.   The patient is FULL code status.  TIME SPENT: 45 minutes.    ____________________________ Donell Beers. Benjie Karvonen, MD spm:sb D: 07/15/2014 12:54:56 ET T: 07/15/2014 13:11:06 ET JOB#: [id number]  cc: Gwin Eagon P. Benjie Karvonen, MD, <Dictator> Eduard Clos. Gilford Rile, MD Donell Beers Hanson Medeiros MD ELECTRONICALLY SIGNED 07/15/2014 14:21

## 2015-04-08 NOTE — Consult Note (Signed)
PATIENT NAME:  [name], [name] MR#:  [id number] DATE OF BIRTH:  [birth year]  DATE OF CONSULTATION:  07/15/2014  REFERRING PHYSICIAN:   CONSULTING PHYSICIAN:  Alinda SierrasEileen A. Cadence Haslam, PA-C  INDICATION FOR CONSULTATION: Chest pain, increased troponin.  HISTORY OF PRESENT ILLNESS: This is a 49 year old pleasant white female who presented to the ER this morning after 2 episodes of chest pain. The first starting at 6 p.m. last night at rest, she describes the pain as tightness in her chest with associated nausea and bilateral arm paresthesia. She denies any shortness of breath or diaphoresis at this time.   REVIEW OF SYSTEMS: She denies fatigue, malaise. She denies any shortness of breath. Positive for chest pain. No palpitations. No edema. No abdominal pain.   PAST MEDICAL HISTORY: Pertinent for hypertension, hypothyroidism, GERD, and tobacco dependence.  HOME MEDICATIONS: Include Aleve 220 mg 2 tablets daily, cod liver oil daily, Synthroid 50 mcg daily, lisinopril 10 mg daily, Nexium 20 mg daily.  ALLERGIES: No known drug allergies.   SOCIAL HISTORY: Includes positive tobacco use. No alcohol or drug use.   FAMILY HISTORY: Pertinent for mother having coronary artery bypass surgery at age 51 and father had CVA.  PAST SURGICAL HISTORY: Includes recent oophorect[facility] June 8th [facility].   PHYSICAL EXAMINATION: GENERAL: The patient is A and O x3, in no apparent distress.  HEENT: Reveals no JVD.  CARDIAC: Reveals regular rate and rhythm. No audible murmurs.  LUNGS: Clear to auscultation. No rales, rhonchi or wheezes. ABDOMEN: Soft and nontender, positive bowel sounds.  EXTREMITIES: No pedal edema.  VITAL SIGNS: Include temperature 98, pulse 90, respirations 20, blood pressure 115/89, 99% saturation on room air.   DIAGNOSTIC DATA: Pertinent lab values include BUN of 7, creatinine 0.64. Troponin 4. CK-MB 48.4.   EKG shows normal sinus rhythm, 82 beats per minute. Left atrial enlargement. No acute  changes.   ASSESSMENT AND PLAN: Non-ST-elevation myocardial infarction. Plan for cardiac catheterization today as the patient has not had anything to drink or eat since yesterday. Agree with aspirin, nitroglycerin, beta blockers and statin medications.   Will follow. Thank you very much for this consultation.  ____________________________ Alinda SierrasEileen A. Jarold Mottoomano, PA-C ear:sb D: 07/15/2014 13:54:00 ET T: 07/15/2014 14:16:29 ET JOB#: [id number]  cc: Marjean DonnaEileen A. Margarito Courseromano, PA-C, <Dictator> Alinda SierrasEILEEN A Brax Walen PA ELECTRONICALLY SIGNED 08/12/2014 17:07

## 2015-04-08 NOTE — Discharge Summary (Signed)
Dates of Admission and Diagnosis:  Date of Admission 15-Jul-2014   Date of Discharge 16-Jul-2014   Admitting Diagnosis NSTEMI   Final Diagnosis Non-ST elevation myocardial infarction, coronary artery diease    Chief Complaint/History of Present Illness a very pleasant 49 year old female with a history of tobacco dependence, hypertension and hypothyroidism who presents with the above complaint. The patient says yesterday she started developing chest pain, about an 8 out of 10, associated with some nausea and some pain in both of her arms, lasted about 5 or 10 minutes, subsided on its own, and then again today, while she was at work, she had similar type pain without the associated symptoms. She asked the nurse at work to check her blood pressure. It was high so she came here for further evaluation. In the ER, she was noted to have a troponin of 4.   Allergies:  No Known Allergies:   Thyroid:  01-Aug-15 05:12   Thyroid Stimulating Hormone 0.996 (0.45-4.50 (International Unit)  ----------------------- Pregnant patients have  different reference  ranges for TSH:  - - - - - - - - - -  Pregnant, first trimetser:  0.36 - 2.50 uIU/mL)  Hepatic:  31-Jul-15 10:39   Bilirubin, Total 0.6  Alkaline Phosphatase 76 (46-116 NOTE: New Reference Range 07/05/14)  SGPT (ALT) 24 (14-63 NOTE: New Reference Range 07/05/14)  SGOT (AST)  47  Total Protein, Serum 7.4  Albumin, Serum 3.8  01-Aug-15 05:12   Bilirubin, Total 0.4  Alkaline Phosphatase 66 (46-116 NOTE: New Reference Range 07/05/14)  SGPT (ALT) 24 (14-63 NOTE: New Reference Range 07/05/14)  SGOT (AST)  55  Total Protein, Serum 6.4  Albumin, Serum  3.3  Cardiac Catherization:  31-Jul-15 14:58   Cardiac Catheterization  [facility] [place], [place] [address] [phone]   Cardiovascular Catheterization Comprehensive Report   Patient: ELVENA OYER Study date: 07/15/2014 MR number: [id number] Account  number: [id number]   DOB: [birth year] Age: 44 years Gender: Female Race: White Height: 61.8 in Weight: 154.7 lb   Diagnostic Cardiologist:  Neoma Laming, MD   SUMMARY:   -CARDIAC STRUCTURES: Global left ventricular function was normal. EF calculated by contrast ventriculography was 60 %.   -Summary: Patient had Left dominant system, with very small RCA which distally got occluded, probably last night with chest pain starting 6 pm last night. Thus distal RCA which is very small non-dominant vessel wouldnot be ammenable to PCI any way and now more than 12 hours later too late for PCI. Her left coronary system has moderate disease in mid LAD at bifurcation with D1, and moderate disease in mid OM3. LVEF is normal about 60 %. Advise aggressive medical treatment with statins asp/plavix and imdur. May go home in am with f/u office monday at 3 pm.   CORONARY CIRCULATION: The coronary circulation is left dominant. Mid LAD: There was a 60 % stenosis. 3rd obtuse marginal: There was a 60 % stenosis. Left AV groove artery: There was a 50 % stenosis. Mid RCA: There was a 100 % stenosis.   VENTRICLES: Global left ventricular function was normal. EF calculated by contrast ventriculography was 60 %.   INDICATIONS: Angina/MI: myocardial infarction without ST elevation (NSTEMI).   HISTORY: No history of previous myocardial infarction. The patient has hypertension and a history of current cigarette use. There was no history of diabetes or dyslipidemia. There was no family history of coronary artery disease. PRIOR CARDIOVASCULAR PROCEDURES: No history of coronary or graft percutaneous intervention  or coronary bypass surgery.   PRIOR DIAGNOSTIC TEST RESULTS: No prior stress test is available.   PROCEDURES PERFORMED: Left heart catheterization with ventriculography. Procedure: Arterial Occlusive Device Procedure: Successful Closure with Mynx   COMPLICATIONS: No complication occurred during  the cath lab visit.   PROCEDURE: The risks and alternatives of the procedures and conscious sedation were explained to the patient and informed consent was obtained. The patient was brought to the cath lab and placed on the table. The planned puncture sites were prepped and draped in the usual sterile fashion.   -Right femoral artery access. The vessel was accessed, a wire was threaded into the vessel, and a was advanced over the wire into the vessel.   -Left heart catheterization. A catheter was advanced to the ascending aorta. Ventriculography was performed using power injection of contrast agent.   -Arterial Occlusive Device.   -Successful Closure with Mynx.   PROCEDURE COMPLETION: There were no complications. TIMING: Test started at 15:23. Test concluded at 15:49. RADIATION EXPOSURE: Fluoroscopy time: 2.08 min. Fluoroscopy dose: 0.804 Gray. MEDICATIONS GIVEN: Fentanyl, 50 mcg, IV, at 15:22. Midazolam, 1 mg, IV, at 15:23. Midazolam, 1 mg, IV, at 15:25. CONTRAST GIVEN: 300 ml Maximum Allowable Contrast Dose. Isovue 150 ml.   Prepared and signed by   Neoma Laming, MD Signed 07/15/2014 15:59:29   STUDY DIAGRAM   Angiographic findings Native coronary lesions:  Mid LAD: Lesion 1: 60 % stenosis.  OM3: Lesion 1: 60 % stenosis.  Left AV groove artery: Lesion 1: 50 % stenosis.  Mid RCA: Lesion 1: 100 % stenosis.   HEMODYNAMIC TABLES   Pressures:  Baseline Pressures:  - HR: 81 Pressures:  - Rhythm: Pressures:  -- Aortic Pressure (S/D/M): 138/82/107 Pressures:  -- Arterial (S/D/M): 146/70/103 Pressures:  -- Left Ventricle (s/edp): 134/17/--   Outputs:  Baseline Outputs:  -- CALCULATIONS[age] 49.64 Outputs:  -- CALCULATIONS: Body Surface Area: 1.71 Outputs:  -- CALCULATIONS: Height in cm: 157.00 Outputs:  -- CALCULATIONS: Sex: Female Outputs:  -- CALCULATIONS: Weight in kg: 70.30  Routine Chem:  31-Jul-15 10:39   Glucose, Serum 95  BUN 7  Creatinine (comp)  0.64  Sodium, Serum 142  Potassium, Serum 3.6  Chloride, Serum  112  CO2, Serum 26  Calcium (Total), Serum 9.0  Osmolality (calc) 281  eGFR (African American) >60  eGFR (Non-African American) >60 (eGFR values <67mL/min/1.73 m2 may be an indication of chronic kidney disease (CKD). Calculated eGFR is useful in patients with stable renal function. The eGFR calculation will not be reliable in acutely ill patients when serum creatinine is changing rapidly. It is not useful in  patients on dialysis. The eGFR calculation may not be applicable to patients at the low and high extremes of body sizes, pregnant women, and vegetarians.)  Anion Gap  4  Result Comment ANTI-XA - SPECIMEN GREATER THAN 2 HOURSE OLD.  - UNABLE TO ADD ON FOR TESTING. WILL  - REORDER FOR COLLECTION. LAB  Result(s) reported on 15 Jul 2014 at 05:39PM.  01-Aug-15 05:12   Glucose, Serum 96  BUN 8  Creatinine (comp) 0.73  Sodium, Serum 142  Potassium, Serum 3.6  Chloride, Serum  110  CO2, Serum 26  Calcium (Total), Serum 8.6  Osmolality (calc) 281  eGFR (African American) >60  eGFR (Non-African American) >60 (eGFR values <93mL/min/1.73 m2 may be an indication of chronic kidney disease (CKD). Calculated eGFR is useful in patients with stable renal function. The eGFR calculation will not be reliable in  acutely ill patients when serum creatinine is changing rapidly. It is not useful in  patients on dialysis. The eGFR calculation may not be applicable to patients at the low and high extremes of body sizes, pregnant women, and vegetarians.)  Anion Gap  6  Cholesterol, Serum 162  Triglycerides, Serum  243  HDL (INHOUSE)  27  VLDL Cholesterol Calculated  49  LDL Cholesterol Calculated 86 (Result(s) reported on 16 Jul 2014 at [facility].)  Cardiac:  31-Jul-15 10:39   Troponin I  4.00 (0.00-0.05 0.05 ng/mL or less: NEGATIVE  Repeat testing in 3-6 hrs  if clinically indicated. >0.05 ng/mL: POTENTIAL  MYOCARDIAL INJURY.  Repeat  testing in 3-6 hrs if  clinically indicated. NOTE: An increase or decrease  of 30% or more on serial  testing suggests a  clinically important change)  CPK-MB, Serum  48.4 (Result(s) reported on 15 Jul 2014 at 12:19PM.)    14:50   Troponin I  6.10 (0.00-0.05 0.05 ng/mL or less: NEGATIVE  Repeat testing in 3-6 hrs  if clinically indicated. >0.05 ng/mL: POTENTIAL  MYOCARDIAL INJURY. Repeat  testing in 3-6 hrs if  clinically indicated. NOTE: An increase or decrease  of 30% or more on serial  testing suggests a  clinically important change)    18:37   Troponin I  7.80 (0.00-0.05 0.05 ng/mL or less: NEGATIVE  Repeat testing in 3-6 hrs  if clinically indicated. >0.05 ng/mL: POTENTIAL  MYOCARDIAL INJURY. Repeat  testing in 3-6 hrs if  clinically indicated. NOTE: An increase or decrease  of 30% or more on serial  testing suggests a  clinically important change)  Routine Coag:  31-Jul-15 10:39   Activated PTT (APTT) 34.6 (A HCT value >55% may artifactually increase the APTT. In one study, the increase was an average of 19%. Reference: "Effect on Routine and Special Coagulation Testing Values of Citrate Anticoagulant Adjustment in Patients with High HCT Values." American Journal of Clinical Pathology 2006;126:400-405.)  Prothrombin 13.4  INR 1.0 (INR reference interval applies to patients on anticoagulant therapy. A single INR therapeutic range for coumarins is not optimal for all indications; however, the suggested range for most indications is 2.0 - 3.0. Exceptions to the INR Reference Range may include: Prosthetic heart valves, acute myocardial infarction, prevention of myocardial infarction, and combinations of aspirin and anticoagulant. The need for a higher or lower target INR must be assessed individually. Reference: The Pharmacology and Management of the Vitamin K  antagonists: the seventh ACCP Conference on Antithrombotic and Thrombolytic Therapy. Chest.2004  Sept:126 (3suppl): L7870634. A HCT value >55% may artifactually increase the PT.  In one study,  the increase was an average of 25%. Reference:  "Effect on Routine and Special Coagulation Testing Values of Citrate Anticoagulant Adjustment in Patients with High HCT Values." American Journal of Clinical Pathology 2006;126:400-405.)  Routine Hem:  31-Jul-15 10:39   WBC (CBC)  13.4  RBC (CBC) 4.76  Hemoglobin (CBC) 14.6  Hematocrit (CBC) 43.9  Platelet Count (CBC) 235 (Result(s) reported on 15 Jul 2014 at 10:55AM.)  MCV 92  MCH 30.8  MCHC 33.4  RDW 14.3  01-Aug-15 05:12   WBC (CBC)  11.3  RBC (CBC) 4.34  Hemoglobin (CBC) 13.3  Hematocrit (CBC) 40.8  Platelet Count (CBC) 214  MCV 94  MCH 30.6  MCHC 32.5  RDW 14.2  Neutrophil % 59.6  Lymphocyte % 30.0  Monocyte % 7.2  Eosinophil % 2.4  Basophil % 0.8  Neutrophil #  6.8  Lymphocyte # 3.4  Monocyte # 0.8  Eosinophil # 0.3  Basophil # 0.1 (Result(s) reported on 16 Jul 2014 at 05:52AM.)   Pertinent Past History:  Pertinent Past History 1.  Tobacco dependence.  2.  Hypertension.  3.  Hypothyroidism.   Hospital Course:  Hospital Course *  Non-ST-Elevation myocardial infarction.    appreciated cardiology help- s/p cath- blockages- suggest medical management. on aspirin, nitroglycerin, beta blocker and statin medications.    also added imdur and plavix- given prescription- d/c home today.  2.  Tobacco dependence. counseled for 3 minutes regarding smoking , now thinking to quit.  3.  History of hypertension. low-normal blood pressure. now stable- on lisinopril and metoprolol.  4.  Hypothyroidism. on Levothyroxine.   Condition on Discharge Good   Code Status:  Code Status Full Code   DISCHARGE INSTRUCTIONS HOME MEDS:  Medication Reconciliation: Patient's Home Medications at Discharge:     Medication Instructions  cod liver oil vitamin a and d oral capsule  1 cap(s) orally once a day   aleve sodium 220 mg oral  tablet  2 tab(s) orally once a day   nicotine 14 mg/24 hr transdermal film, extended release  1 patch transdermal once a day   levothyroxine 50 mcg (0.05 mg) oral tablet  1 tab(s) orally once a day   lisinopril 10 mg oral tablet  1 tab(s) orally once a day   isosorbide mononitrate 30 mg oral tablet, extended release  1 tab(s) orally once a day   nitroglycerin 0.4 mg sublingual tablet  1 tab(s) sublingual 4 times a day, As Needed, chest pain , As needed, chest pain   atorvastatin 80 mg oral tablet  1 tab(s) orally once a day   aspirin 81 mg oral delayed release tablet  1 tab(s) orally once a day   clopidogrel 75 mg oral tablet  1 tab(s) orally once a day   metoprolol succinate 25 mg oral tablet, extended release  1 tab(s) orally once a day   pantoprazole 20 mg oral delayed release tablet  1 tab(s) orally once a day    STOP TAKING THE FOLLOWING MEDICATION(S):    nexium 20 mg oral delayed release capsule: 2 cap(s) orally once a day  Physician's Instructions:  Home Health? No   Treatments None   Dressing Care Remove dressing tomorrow.  May shower.   Home Oxygen? No   Diet Low Fat, Low Cholesterol   Dietary Supplements None   Activity Limitations As tolerated   Return to Work 1 week  Not Applicable   Time frame for Follow Up Appointment 1-2 days  DR.khan's office.     Neoma Laming Ali(Consultant): [employer] , [address], [place], [place] [address], [place]   Gilford Rile, Jennifer(Family Physician): [facility], [address] [place], [place] [address], [place] [phone]  Electronic Signatures: Vaughan Basta (MD)  (Signed 02-Aug-15 12:15)  Authored: ADMISSION DATE AND DIAGNOSIS, CHIEF COMPLAINT/HPI, Allergies, PERTINENT LABS, PERTINENT PAST [place], PATIENT INSTRUCTIONS, Follow Up Physician   Last Updated: 02-Aug-15 12:15 by Vaughan Basta (MD)

## 2015-05-24 ENCOUNTER — Telehealth: Payer: Self-pay | Admitting: Obstetrics and Gynecology

## 2015-05-24 NOTE — Telephone Encounter (Signed)
[facility]ARMAC[facility] CALLED [facility] [phone] ABOUT THE RX Horatio PelXANEX FOR PT. HAD A QUESTION AND WOULD LIKE A CLL BACK.

## 2015-05-25 NOTE — Telephone Encounter (Signed)
Called pt's pharmacy ([facility]) they stated that they had sent [place] ([facility]) a refill request for Xanax however the pharmacy received a prescription for Xanax yesterday from [facility]. Dr.Cherry informed that pt is getting xanax from another provider as well as her.

## 2015-06-08 ENCOUNTER — Encounter: Payer: Self-pay | Admitting: Obstetrics and Gynecology

## 2015-07-28 ENCOUNTER — Other Ambulatory Visit: Payer: Self-pay | Admitting: Cardiology

## 2015-07-28 DIAGNOSIS — F17200 Nicotine dependence, unspecified, uncomplicated: Secondary | ICD-10-CM

## 2015-08-02 ENCOUNTER — Ambulatory Visit: Payer: BLUE CROSS/BLUE SHIELD

## 2016-03-12 NOTE — Anesthesia Preprocedure Evaluation (Addendum)
Anesthesia Evaluation  Patient identified by MRN, date of birth, ID band Patient awake    Reviewed: Allergy & Precautions, H&P , NPO status , Patient's Chart, lab work & pertinent test results  History of Anesthesia Complications Negative for: history of anesthetic complications  Airway Mallampati: I  TM Distance: >3 FB Neck ROM: full    Dental no notable dental hx.    Pulmonary Current Smoker,    Pulmonary exam normal        Cardiovascular hypertension, On Medications + CAD  Normal cardiovascular exam     Neuro/Psych    GI/Hepatic Neg liver ROS, GERD  Medicated,  Endo/Other  negative endocrine ROS  Renal/GU negative Renal ROS     Musculoskeletal   Abdominal   Peds  Hematology negative hematology ROS (+)   Anesthesia Other Findings   Reproductive/Obstetrics                            Anesthesia Physical Anesthesia Plan  ASA: III  Anesthesia Plan: General ETT   Post-op Pain Management:    Induction:   Airway Management Planned:   Additional Equipment:   Intra-op Plan:   Post-operative Plan:   Informed Consent: I have reviewed the patients History and Physical, chart, labs and discussed the procedure including the risks, benefits and alternatives for the proposed anesthesia with the patient or authorized representative who has indicated his/her understanding and acceptance.     Plan Discussed with: CRNA  Anesthesia Plan Comments:         Anesthesia Quick Evaluation

## 2016-03-19 ENCOUNTER — Encounter
Admission: RE | Disposition: A | Discharge status: Home / Self Care | Payer: Self-pay | Source: Ambulatory Visit | Attending: Otolaryngology

## 2016-03-19 ENCOUNTER — Ambulatory Visit: Payer: BLUE CROSS/BLUE SHIELD | Admitting: Anesthesiology

## 2016-03-19 ENCOUNTER — Ambulatory Visit
Admission: RE | Admit: 2016-03-19 | Discharge: 2016-03-19 | Disposition: A | Discharge status: Home / Self Care | Payer: BLUE CROSS/BLUE SHIELD | Source: Ambulatory Visit | Attending: Otolaryngology | Admitting: Otolaryngology

## 2016-03-19 DIAGNOSIS — Z7982 Long term (current) use of aspirin: Secondary | ICD-10-CM | POA: Insufficient documentation

## 2016-03-19 DIAGNOSIS — J320 Chronic maxillary sinusitis: Secondary | ICD-10-CM | POA: Diagnosis not present

## 2016-03-19 DIAGNOSIS — F17200 Nicotine dependence, unspecified, uncomplicated: Secondary | ICD-10-CM | POA: Insufficient documentation

## 2016-03-19 DIAGNOSIS — Z823 Family history of stroke: Secondary | ICD-10-CM | POA: Diagnosis not present

## 2016-03-19 DIAGNOSIS — Z8249 Family history of ischemic heart disease and other diseases of the circulatory system: Secondary | ICD-10-CM | POA: Diagnosis not present

## 2016-03-19 DIAGNOSIS — Z825 Family history of asthma and other chronic lower respiratory diseases: Secondary | ICD-10-CM | POA: Diagnosis not present

## 2016-03-19 DIAGNOSIS — J343 Hypertrophy of nasal turbinates: Secondary | ICD-10-CM | POA: Insufficient documentation

## 2016-03-19 DIAGNOSIS — Z79899 Other long term (current) drug therapy: Secondary | ICD-10-CM | POA: Diagnosis not present

## 2016-03-19 DIAGNOSIS — J342 Deviated nasal septum: Secondary | ICD-10-CM | POA: Insufficient documentation

## 2016-03-19 DIAGNOSIS — J3489 Other specified disorders of nose and nasal sinuses: Secondary | ICD-10-CM | POA: Insufficient documentation

## 2016-03-19 DIAGNOSIS — I2510 Atherosclerotic heart disease of native coronary artery without angina pectoris: Secondary | ICD-10-CM | POA: Diagnosis not present

## 2016-03-19 HISTORY — DX: Chronic sinusitis, unspecified: J32.9

## 2016-03-19 HISTORY — DX: Deviated nasal septum: J34.2

## 2016-03-19 HISTORY — DX: Dental restoration status: Z98.811

## 2016-03-19 HISTORY — PX: MAXILLARY ANTROSTOMY: SHX2003

## 2016-03-19 HISTORY — PX: NASAL TURBINATE REDUCTION: SHX2072

## 2016-03-19 HISTORY — DX: Essential (primary) hypertension: I10

## 2016-03-19 HISTORY — DX: Hypertrophy of nasal turbinates: J34.3

## 2016-03-19 HISTORY — DX: Unspecified hearing loss, unspecified ear: H91.90

## 2016-03-19 HISTORY — DX: Headache, unspecified: R51.9

## 2016-03-19 HISTORY — DX: Headache: R51

## 2016-03-19 HISTORY — PX: SEPTOPLASTY: SHX2393

## 2016-03-19 HISTORY — PX: ENDOSCOPIC CONCHA BULLOSA RESECTION: SHX6395

## 2016-03-19 SURGERY — SEPTOPLASTY, NOSE
Anesthesia: General | Site: Nose | Laterality: Right | Wound class: Clean Contaminated

## 2016-03-19 MED ORDER — ONDANSETRON HCL 4 MG/2ML IJ SOLN
INTRAMUSCULAR | Status: DC | PRN
  Administered 2016-03-19: 4 mg via INTRAVENOUS

## 2016-03-19 MED ORDER — LIDOCAINE HCL (CARDIAC) 20 MG/ML IV SOLN
INTRAVENOUS | Status: DC | PRN
  Administered 2016-03-19: 40 mg via INTRAVENOUS

## 2016-03-19 MED ORDER — DEXAMETHASONE SODIUM PHOSPHATE 4 MG/ML IJ SOLN
INTRAMUSCULAR | Status: DC | PRN
  Administered 2016-03-19: 10 mg via INTRAVENOUS

## 2016-03-19 MED ORDER — ACETAMINOPHEN 10 MG/ML IV SOLN
1000.0000 mg | Freq: Four times a day (QID) | INTRAVENOUS | Status: DC
  Administered 2016-03-19: 1000 mg via INTRAVENOUS

## 2016-03-19 MED ORDER — ROCURONIUM BROMIDE 100 MG/10ML IV SOLN
INTRAVENOUS | Status: DC | PRN
  Administered 2016-03-19: 20 mg via INTRAVENOUS

## 2016-03-19 MED ORDER — MIDAZOLAM HCL 5 MG/5ML IJ SOLN
INTRAMUSCULAR | Status: DC | PRN
  Administered 2016-03-19: 2 mg via INTRAVENOUS

## 2016-03-19 MED ORDER — EPHEDRINE SULFATE 50 MG/ML IJ SOLN
INTRAMUSCULAR | Status: DC | PRN
  Administered 2016-03-19: 10 mg via INTRAVENOUS
  Administered 2016-03-19 (×3): 5 mg via INTRAVENOUS

## 2016-03-19 MED ORDER — LACTATED RINGERS IV SOLN
INTRAVENOUS | Status: DC
  Administered 2016-03-19 (×2): via INTRAVENOUS

## 2016-03-19 MED ORDER — PROPOFOL 10 MG/ML IV BOLUS
INTRAVENOUS | Status: DC | PRN
  Administered 2016-03-19: 40 mg via INTRAVENOUS
  Administered 2016-03-19: 150 mg via INTRAVENOUS

## 2016-03-19 MED ORDER — FENTANYL CITRATE (PF) 100 MCG/2ML IJ SOLN
INTRAMUSCULAR | Status: DC | PRN
  Administered 2016-03-19: 100 ug via INTRAVENOUS

## 2016-03-19 MED ORDER — OXYMETAZOLINE HCL 0.05 % NA SOLN
NASAL | Status: DC | PRN
  Administered 2016-03-19: 1 via TOPICAL

## 2016-03-19 MED ORDER — SCOPOLAMINE 1 MG/3DAYS TD PT72
1.0000 | MEDICATED_PATCH | TRANSDERMAL | Status: DC
  Administered 2016-03-19: 1.5 mg via TRANSDERMAL

## 2016-03-19 MED ORDER — GLYCOPYRROLATE 0.2 MG/ML IJ SOLN
INTRAMUSCULAR | Status: DC | PRN
  Administered 2016-03-19: 0.1 mg via INTRAVENOUS

## 2016-03-19 MED ORDER — HYDROCODONE-ACETAMINOPHEN 5-325 MG PO TABS: 1.0000 | ORAL_TABLET | ORAL | Status: DC | PRN

## 2016-03-19 MED ORDER — CEFDINIR 300 MG PO CAPS: ORAL_CAPSULE | ORAL | Status: DC

## 2016-03-19 MED ORDER — LIDOCAINE-EPINEPHRINE 1 %-1:100000 IJ SOLN
INTRAMUSCULAR | Status: DC | PRN
  Administered 2016-03-19: 14 mL

## 2016-03-19 SURGICAL SUPPLY — 22 items
CANISTER SUCT 1200ML W/VALVE (MISCELLANEOUS) ×4 IMPLANT
COAG SUCT 10F 3.5MM HAND CTRL (MISCELLANEOUS) ×4 IMPLANT
DRAPE HEAD BAR (DRAPES) ×4 IMPLANT
DRESSING NASL FOAM PST OP SINU (MISCELLANEOUS) ×3 IMPLANT
DRSG NASAL FOAM POST OP SINU (MISCELLANEOUS) ×4
GLOVE EXAM LX STRL 7.5 (GLOVE) ×12 IMPLANT
KIT ROOM TURNOVER OR (KITS) ×4 IMPLANT
NEEDLE HYPO 25GX1X1/2 BEV (NEEDLE) ×4 IMPLANT
NS IRRIG 500ML POUR BTL (IV SOLUTION) ×4 IMPLANT
PACK DRAPE NASAL/ENT (PACKS) ×4 IMPLANT
PAD GROUND ADULT SPLIT (MISCELLANEOUS) ×4 IMPLANT
PATTIES SURGICAL .5 X3 (DISPOSABLE) ×4 IMPLANT
SPLINT NASAL SEPTAL PRE-CUT (MISCELLANEOUS) ×4 IMPLANT
SUT CHROMIC 4 0 RB 1X27 (SUTURE) ×4 IMPLANT
SUT ETHILON 3-0 FS-10 30 BLK (SUTURE)
SUT ETHILON 4-0 (SUTURE) ×1
SUT ETHILON 4-0 FS2 18XMFL BLK (SUTURE) ×3
SUT PLAIN GUT 4-0 (SUTURE) ×4 IMPLANT
SUTURE EHLN 3-0 FS-10 30 BLK (SUTURE) IMPLANT
SUTURE ETHLN 4-0 FS2 18XMF BLK (SUTURE) ×3 IMPLANT
SYRINGE 10CC LL (SYRINGE) ×4 IMPLANT
TOWEL OR 17X26 4PK STRL BLUE (TOWEL DISPOSABLE) ×4 IMPLANT

## 2016-03-19 NOTE — Transfer of Care (Signed)
Immediate Anesthesia Transfer of Care Note  Patient: Jennifer HaileyDiane K Vanlanen  Procedure(s) Performed: Procedure(s) with comments: SEPTOPLASTY (N/A) - GAVE DISK TO CECE 3/27 (IF NEEDED) TURBINATE REDUCTION/SUBMUCOSAL RESECTION (Bilateral) MAXILLARY ANTROSTOMY (Bilateral) ENDOSCOPIC CONCHA BULLOSA RESECTION (Right)  Patient Location: PACU  Anesthesia Type: General ETT  Level of Consciousness: awake, alert  and patient cooperative  Airway and Oxygen Therapy: Patient Spontanous Breathing and Patient connected to supplemental oxygen  Post-op Assessment: Post-op Vital signs reviewed, Patient's Cardiovascular Status Stable, Respiratory Function Stable, Patent Airway and No signs of Nausea or vomiting  Post-op Vital Signs: Reviewed and stable  Complications: No apparent anesthesia complications

## 2016-03-19 NOTE — Anesthesia Postprocedure Evaluation (Signed)
Anesthesia Post Note  Patient: [name]  Procedure(s) Performed: Procedure(s) (LRB): SEPTOPLASTY (N/A) TURBINATE REDUCTION/SUBMUCOSAL RESECTION (Bilateral) MAXILLARY ANTROSTOMY (Bilateral) ENDOSCOPIC CONCHA BULLOSA RESECTION (Right)  Patient location during evaluation: PACU Anesthesia Type: General Level of consciousness: awake and alert Pain management: pain level controlled Vital Signs Assessment: post-procedure vital signs reviewed and stable Respiratory status: spontaneous breathing and respiratory function stable Cardiovascular status: stable Anesthetic complications: no    Ami Mally, III,  Jayel Inks D

## 2016-03-19 NOTE — Op Note (Signed)
03/19/2016  1:15 PM    [name]  [id number]   Pre-Op Diagnosis:  Nasal obstruction with septal deviation and inferior turbinate hypertrophy, chronic maxillary sinusitis, right concha bullosa Post-op Diagnosis: Same  Procedure: 1) Nasal Septoplasty, 2) Bilateral submucous resection of the inferior turbinates, 3) Bilateral endoscopic maxillary antrostomies, 4) Right endoscopic concha bullosa excision Surgeon:  [name]  Anesthesia:  General endotracheal  EBL:  25 cc  Complications:  None  Findings: Right septal deviation with left septal spur. Hypertrophy of the inferior turbinates. Mucosal thickening in the maxillary sinuses with mucoid material on the right, and large right concha bullosa  Procedure: The patient was taken to the Operating Room and placed in the supine position.  After induction of general endotracheal anesthesia, the table was turned 90 degrees. A time-out was issued to confirm the site and procedure. The nasal septum, middle turbinates, uncinate process and inferior turbinates where then injected with 1% lidocaine with epiniephrine, 1: 100,000. The nose was decongested with Afrin soaked pledgets. The patient was then prepped and draped in the usual sterile fashion. Beginning on the left hand side a hemitransfixion incision was then created on the leading edge of the septum on the left.  A subperichondrial plane was elevated posteriorly on the left and taken back to the perpendicular plate of the ethmoid where subperiosteal plane was elevated posteriorly on the left. A large septal spur was identified on the right hand side.  An inferior rim of redundant septal cartilage was removed from where it deviated over the maxillary crest. The perpendicular plate of the ethmoid was separated from the quadrangular cartilage, and a subperiosteal plane elevated on the right of the bony septum. The large septal spur was removed, dividing the septal bone superiorly with Knight  scissors, and inferiorly from the maxillary crest with a chisel.  the septum was freed up on the right side, elevating a subperichondrial plane.  The septum was then replaced in the midline. Reinspection through each nostril showed excellent reduction of the septal deformity. A left posterior inferior fenestration was then created to allow hematoma drainage.  The septal incision was closed with 4-0 chromic gut suture. A 4-0 plain gut suture was used to reapproximate the septal flaps to the underlying cartilage, utilizing a running, quilting type stitch.   With the septoplasty completed, beginning on the left-hand side, a 15 blade was used to incise along the inferior edge of the inferior turbinate. A superior laterally based flap of the medial turbinate mucosa was then elevated. A portion of the underlying conchal bone and lateral mucosa was excised using Knight scissors. The flap was then laid back over the turbinate stump and the bleeding edge of the turbinate cauterized using suction cautery. In a similar fashion the submucous resection was performed on the right.     A 0 endoscope was then used to inspect the left nasal cavity. The left middle turbinate was medialized and the uncinate process then resected with through-cutting forceps as well as the microdebrider. In this fashion the uncinate was completely removed along with soft tissue and bone of the medial wall of the maxillary sinus to create a large patent maxillary antrostomy. The left maxillary sinus was suctioned to clear secretions.   Attention was then turned to the right side where a concha bullosa was noted. This was incised anteriorly with a sickle knife, and the lateral wall of the concha bullosa resected with endoscopic scissors. This was removed, and the edges of the  remaining middle turbinate trimmed somewhat with the microdebrider. Next a right maxillary antrostomy was created, removing soft tissue and bone including the uncinate  process as described above. Mucoid material was suctioned from the right maxillary sinus. The microdebrider was used to clean up the edges of the maxillary antrostomies as well as the inferior turbinates on either side. The nose was suctioned and inspected. Nasal septal splints were then placed on either side of the nasal septum and secured with 3-0 nylon suture. Stammberger absorbable sinus packing was then placed in the middle meatus bilaterally.   The patient was then returned to the anesthesiologist for awakening and taken to recovery room in good condition postoperatively.  Disposition:   PACU to home  Plan: Soft, bland diet and push fluids, advance diet as tolerated. Take pain medications and antibiotics as prescribed. No strenuous activity for 2 weeks. Follow-up in 1 week. Begin saline irrigations to each nostril 3-4 times daily starting tomorrow. Do not blow the nose.  Jennifer MealyBennett, [name] 03/19/2016 1:15 PM

## 2016-03-19 NOTE — H&P (Signed)
History and physical reviewed and will be scanned in later. No change in medical status reported by the patient or family, appears stable for surgery. All questions regarding the procedure answered, and patient (or family if a child) expressed understanding of the procedure.  [name] S @TODAY@ 

## 2016-03-19 NOTE — Discharge Instructions (Signed)
[facility] °[facility] °ENDOSCOPIC SINUS SURGERY °[facility] ° °What is Functional Endoscopic Sinus Surgery? ° The Surgery involves making the natural openings of the sinuses larger by removing the bony partitions that separate the sinuses from the nasal cavity.  The natural sinus lining is preserved as much as possible to allow the sinuses to resume normal function after the surgery.  In some patients nasal polyps (excessively swollen lining of the sinuses) may be removed to relieve obstruction of the sinus openings.  The surgery is performed through the nose using lighted scopes, which eliminates the need for incisions on the face.  A septoplasty is a different procedure which is sometimes performed with sinus surgery.  It involves straightening the boy partition that separates the two sides of your nose.  A crooked or deviated septum may need repair if is obstructing the sinuses or nasal airflow.  Turbinate reduction is also often performed during sinus surgery.  The turbinates are bony proturberances from the side walls of the nose which swell and can obstruct the nose in patients with sinus and allergy problems.  Their size can be surgically reduced to help relieve nasal obstruction. ° °What Can Sinus Surgery Do For Me? ° Sinus surgery can reduce the frequency of sinus infections requiring antibiotic treatment.  This can provide improvement in nasal congestion, post-nasal drainage, facial pressure and nasal obstruction.  Surgery will NOT prevent you from ever having an infection again, so it usually only for patients who get infections 4 or more times yearly requiring antibiotics, or for infections that do not clear with antibiotics.  It will not cure nasal allergies, so patients with allergies may still require medication to treat their allergies after surgery. Surgery may improve headaches related to sinusitis, however, some people will continue to  require medication to control sinus headaches related to allergies.  Surgery will do nothing for other forms of headache (migraine, tension or cluster). ° °What Are the Risks of Endoscopic Sinus Surgery? ° Current techniques allow surgery to be performed safely with little risk, however, there are rare complications that patients should be aware of.  Because the sinuses are located around the eyes, there is risk of eye injury, including blindness, though again, this would be quite rare. This is usually a result of bleeding behind the eye during surgery, which puts the vision oat risk, though there are treatments to protect the vision and prevent permanent disrupted by surgery causing a leak of the spinal fluid that surrounds the brain.  More serious complications would include bleeding inside the brain cavity or damage to the brain.  Again, all of these complications are uncommon, and spinal fluid leaks can be safely managed surgically if they occur.  The most common complication of sinus surgery is bleeding from the nose, which may require packing or cauterization of the nose.  Continued sinus have polyps may experience recurrence of the polyps requiring revision surgery.  Alterations of sense of smell or injury to the tear ducts are also rare complications.  ° °What is the Surgery Like, and what is the Recovery? ° The Surgery usually takes a couple of hours to perform, and is usually performed under a general anesthetic (completely asleep).  Patients are usually discharged home after a couple of hours.  Sometimes during surgery it is necessary to pack the nose to control bleeding, and the packing is left in place for 24 - 48 hours, and removed by your surgeon.    If a septoplasty was performed during the procedure, there is often a splint placed which must be removed after 5-7 days.   °Discomfort: Pain is usually mild to moderate, and can be controlled by prescription pain medication or acetaminophen (Tylenol).   Aspirin, Ibuprofen (Advil, Motrin), or Naprosyn (Aleve) should be avoided, as they can cause increased bleeding.  Most patients feel sinus pressure like they have a bad head cold for several days.  Sleeping with your head elevated can help reduce swelling and facial pressure, as can ice packs over the face.  A humidifier may be helpful to keep the mucous and blood from drying in the nose.  ° °Diet: There are no specific diet restrictions, however, you should generally start with clear liquids and a light diet of bland foods because the anesthetic can cause some nausea.  Advance your diet depending on how your stomach feels.  Taking your pain medication with food will often help reduce stomach upset which pain medications can cause. ° °Nasal Saline Irrigation: It is important to remove blood clots and dried mucous from the nose as it is healing.  This is done by having you irrigate the nose at least 3 - 4 times daily with a salt water solution.  We recommend using NeilMed Sinus Rinse (available at the drug store).  Fill the squeeze bottle with the solution, bend over a sink, and insert the tip of the squeeze bottle into the nose ½ of an inch.  Point the tip of the squeeze bottle towards the inside corner of the eye on the same side your irrigating.  Squeeze the bottle and gently irrigate the nose.  If you bend forward as you do this, most of the fluid will flow back out of the nose, instead of down your throat.   The solution should be warm, near body temperature, when you irrigate.   Each time you irrigate, you should use a full squeeze bottle.  ° °Note that if you are instructed to use Nasal Steroid Sprays at any time after your surgery, irrigate with saline BEFORE using the steroid spray, so you do not wash it all out of the nose. °Another product, Nasal Saline Gel (such as AYR Nasal Saline Gel) can be applied in each nostril 3 - 4 times daily to moisture the nose and reduce scabbing or crusting. ° °Bleeding:   Bloody drainage from the nose can be expected for several days, and patients are instructed to irrigate their nose frequently with salt water to help remove mucous and blood clots.  The drainage may be dark red or brown, though some fresh blood may be seen intermittently, especially after irrigation.  Do not blow you nose, as bleeding may occur. If you must sneeze, keep your mouth open to allow air to escape through your mouth. ° °If heavy bleeding occurs: Irrigate the nose with saline to rinse out clots, then spray the nose 3 - 4 times with Afrin Nasal Decongestant Spray.  The spray will constrict the blood vessels to slow bleeding.  Pinch the lower half of your nose shut to apply pressure, and lay down with your head elevated.  Ice packs over the nose may help as well. If bleeding persists despite these measures, you should notify your doctor.  Do not use the Afrin routinely to control nasal congestion after surgery, as it can result in worsening congestion and may affect healing.  ° ° ° °Activity: Return to work varies among patients. Most patients will be   out of work at least 5 - 7 days to recover.  Patient may return to work after they are off of narcotic pain medication, and feeling well enough to perform the functions of their job.  Patients must avoid heavy lifting (over 10 pounds) or strenuous physical for 2 weeks after surgery, so your employer may need to assign you to light duty, or keep you out of work longer if light duty is not possible.  NOTE: you should not drive, operate dangerous machinery, do any mentally demanding tasks or make any important legal or financial decisions while on narcotic pain medication and recovering from the general anesthetic.  °  °Call Your Doctor Immediately if You Have Any of the Following: °1. Bleeding that you cannot control with the above measures °2. Loss of vision, double vision, bulging of the eye or black eyes. °3. Fever over 101 degrees °4. Neck stiffness with  severe headache, fever, nausea and change in mental state. °You are always encourage to call anytime with concerns, however, please call with requests for pain medication refills during office hours. ° °Office Endoscopy: During follow-up visits your doctor will remove any packing or splints that may have been placed and evaluate and clean your sinuses endoscopically.  Topical anesthetic will be used to make this as comfortable as possible, though you may want to take your pain medication prior to the visit.  How often this will need to be done varies from patient to patient.  After complete recovery from the surgery, you may need follow-up endoscopy from time to time, particularly if there is concern of recurrent infection or nasal polyps. ° °General Anesthesia, Adult, Care After °Refer to this sheet in the next few weeks. These instructions provide you with information on caring for yourself after your procedure. Your health care provider may also give you more specific instructions. Your treatment has been planned according to current medical practices, but problems sometimes occur. Call your health care provider if you have any problems or questions after your procedure. °WHAT TO EXPECT AFTER THE PROCEDURE °After the procedure, it is typical to experience: °· Sleepiness. °· Nausea and vomiting. °HOME CARE INSTRUCTIONS °· For the first 24 hours after general anesthesia: °¨ Have a responsible person with you. °¨ Do not drive a car. If you are alone, do not take public transportation. °¨ Do not drink alcohol. °¨ Do not take medicine that has not been prescribed by your health care provider. °¨ Do not sign important papers or make important decisions. °¨ You may resume a normal diet and activities as directed by your health care provider. °· Change bandages (dressings) as directed. °· If you have questions or problems that seem related to general anesthesia, call the hospital and ask for the anesthetist or  anesthesiologist on call. °SEEK MEDICAL CARE IF: °· You have nausea and vomiting that continue the day after anesthesia. °· You develop a rash. °SEEK IMMEDIATE MEDICAL CARE IF:  °· You have difficulty breathing. °· You have chest pain. °· You have any allergic problems. °  °This information is not intended to replace advice given to you by your health care provider. Make sure you discuss any questions you have with your health care provider. °  °Document Released: 03/10/2001 Document Revised: 12/23/2014 Document Reviewed: 04/01/2012 °Elsevier Interactive Patient Education ©2016 [employer] ° °

## 2016-03-19 NOTE — Anesthesia Procedure Notes (Signed)
Procedure Name: Intubation Date/Time: 03/19/2016 11:56 AM Performed by: Jimmy PicketAMYOT, Maizie Garno Pre-anesthesia Checklist: Patient identified, Emergency Drugs available, Suction available, Patient being monitored and Timeout performed Patient Re-evaluated:Patient Re-evaluated prior to inductionOxygen Delivery Method: Circle system utilized Preoxygenation: Pre-oxygenation with 100% oxygen Intubation Type: IV induction Ventilation: Mask ventilation without difficulty Laryngoscope Size: Miller and 2 Grade View: Grade I Tube type: Oral Rae Tube size: 7.0 mm Number of attempts: 1 Placement Confirmation: ETT inserted through vocal cords under direct vision,  positive ETCO2 and breath sounds checked- equal and bilateral Tube secured with: Tape Dental Injury: Teeth and Oropharynx as per pre-operative assessment

## 2016-03-20 ENCOUNTER — Encounter: Payer: Self-pay | Admitting: Otolaryngology

## 2016-03-21 LAB — SURGICAL PATHOLOGY

## 2017-06-17 ENCOUNTER — Ambulatory Visit (INDEPENDENT_AMBULATORY_CARE_PROVIDER_SITE_OTHER): Payer: BLUE CROSS/BLUE SHIELD | Admitting: Obstetrics and Gynecology

## 2017-06-17 VITALS — BP 120/80 | HR 90 | Ht 62.0 in | Wt 130.5 lb

## 2017-06-17 DIAGNOSIS — N952 Postmenopausal atrophic vaginitis: Secondary | ICD-10-CM

## 2017-06-17 DIAGNOSIS — Z113 Encounter for screening for infections with a predominantly sexual mode of transmission: Secondary | ICD-10-CM

## 2017-06-17 NOTE — Progress Notes (Signed)
    GYNECOLOGY PROGRESS NOTE  Subjective:    Patient ID: [name], female    DOB: 09/04/1966, 51 y.o.   MRN: [id number]  HPI  Patient is a 51 y.o. 29P2002 female who presents for STD check.  Sexual history reviewed with the patient. STD exposure: sexual contact with individual with uncertain background (boyfriend of 16 years, recently concerned with his fidelity after abrupt cessation of relationship).  Previous history of STD:  Chlamydia (remote history). Current symptoms include vaginal irritation: mild.  Contraception: post menopausal status.  The following portions of the patient's history were reviewed and updated as appropriate: allergies, current medications, past family history, past medical history, past social history, past surgical history and problem list.   Review of Systems Pertinent items noted in HPI and remainder of comprehensive ROS otherwise negative.   Objective:   Blood pressure 120/80, pulse 90, height 5\' 2"  (1.575 m), weight 130 lb 8 oz (59.2 kg). General appearance: alert and no distress Abdomen: soft, non-tender; bowel sounds normal; no masses,  no organomegaly Pelvic: external genitalia normal, rectovaginal septum normal.  Vagina with small amount of white discharge, no odor.  Mild atrophy present.  Cervix normal appearing, no lesions and no motion tenderness.  Uterus mobile, nontender, normal shape and size.  Adnexae non-palpable, nontender bilaterally.    Assessment:   STD screen Vaginal atrophy   Plan:   - STD screening performed today at patient's request (including serology).  Discussed safe sex practices. Patient notes that she is currently no longer with previous partner.   - Vaginal atrophy present on today's exam, mild.  Noted that her mild vaginal irritation could also be a symptom of this.  Patient notes that as long as her symptoms are not related to an STD, they are manageable and would not seek treatment for it.     Hildred Laserherry, Fabyan Loughmiller,  MD [facility]

## 2017-06-18 LAB — HEPATITIS C ANTIBODY: Hep C Virus Ab: <0.1 s/co ratio (ref 0.0–0.9)

## 2017-06-18 LAB — RPR: RPR Ser Ql: NONREACTIVE

## 2017-06-18 LAB — HEPATITIS B SURFACE ANTIGEN: Hepatitis B Surface Ag: NEGATIVE

## 2017-06-18 LAB — HIV ANTIBODY (ROUTINE TESTING W REFLEX): HIV Screen 4th Generation wRfx: NONREACTIVE

## 2017-06-24 ENCOUNTER — Telehealth: Payer: Self-pay | Admitting: Obstetrics and Gynecology

## 2017-06-24 LAB — NUSWAB VAGINITIS PLUS (VG+)
Atopobium vaginae: HIGH Score — AB
CANDIDA ALBICANS, NAA: NEGATIVE
CANDIDA GLABRATA, NAA: NEGATIVE
Chlamydia trachomatis, NAA: NEGATIVE
Neisseria gonorrhoeae, NAA: NEGATIVE
TRICH VAG BY NAA: NEGATIVE

## 2017-06-24 NOTE — Telephone Encounter (Signed)
Patient lvm wanting to speak with Dr. Valentino Saxonherry or Edson Snowballki to discuss results from her blood work last Tuesday 06/17/17. Patient did not disclose any other information. Please advise.

## 2017-06-25 NOTE — Telephone Encounter (Signed)
Called pt informed her of negative results, per Dr.cherry. (see result note)

## 2017-09-04 DIAGNOSIS — Z87891 Personal history of nicotine dependence: Secondary | ICD-10-CM | POA: Diagnosis not present

## 2017-09-04 DIAGNOSIS — I10 Essential (primary) hypertension: Secondary | ICD-10-CM | POA: Diagnosis not present

## 2017-09-04 DIAGNOSIS — F419 Anxiety disorder, unspecified: Secondary | ICD-10-CM | POA: Diagnosis not present

## 2017-09-04 DIAGNOSIS — R12 Heartburn: Secondary | ICD-10-CM | POA: Diagnosis not present

## 2018-07-28 ENCOUNTER — Encounter: Payer: Self-pay | Admitting: Obstetrics and Gynecology

## 2018-07-28 ENCOUNTER — Ambulatory Visit (INDEPENDENT_AMBULATORY_CARE_PROVIDER_SITE_OTHER): Payer: 59 | Admitting: Obstetrics and Gynecology

## 2018-07-28 VITALS — BP 154/93 | HR 90 | Ht 63.0 in | Wt 119.6 lb

## 2018-07-28 DIAGNOSIS — Z113 Encounter for screening for infections with a predominantly sexual mode of transmission: Secondary | ICD-10-CM

## 2018-07-28 NOTE — Progress Notes (Signed)
    GYNECOLOGY PROGRESS NOTE Subjective:    Patient ID: [name], female    DOB: 08/24/1966, 52 y.o.   MRN: [id number]  HPI  Patient is a 52 y.o.  female who presents for sexually transmitted disease check. Sexual history reviewed with the patient. She has had 2 partners this year and would just desire to be thoroughly screened. She does note that one of the partners did report having an outside sexual encounter 2-3 times.  STD exposure: denies knowledge of risky exposure.  Previous history of STD:  none. Current symptoms include none.  Contraception: status post hysterectomy. Patient also desires a pap smear as a co-worker was recently diagnosed with HPV infection.     The following portions of the patient's history were reviewed and updated as appropriate: allergies, current medications, past family history, past medical history, past social history, past surgical history and problem list.  Review of Systems A comprehensive review of systems was negative.   Objective:   Blood pressure (!) 154/93, pulse 90, height 5\' 3"  (1.6 m), weight 119 lb 9.6 oz (54.3 kg). General appearance: alert and no distress Abdomen: soft, non-tender; bowel sounds normal; no masses,  no organomegaly Pelvic: external genitalia normal, no adnexal masses or tenderness and uterus surgically absent, cervix surgically absent.    Assessment:   STD screening  Plan:   - Discussion had that patient no longer has a cervix, and so could not have an infections that could cause cervicitis (GC/Cl). Also informed that since she had a hysterectomy and did not have any h/o cervical dysplasia prior to her hysterectomy that the chances of HPV infection are low (however not impossible). She is not currently having any symptoms. She does not require a pap smear at this time.  Does not desire herpes testing.  - Offered serology STD screening (HIV, RPR) but patient notes she had it done last year. Is not concerned about these  infections.    Hildred Laserherry, Carla Rashad, MD [facility]

## 2018-07-28 NOTE — Progress Notes (Signed)
Pt stated that she is present today for a STD/HPV pap testing.

## 2018-08-31 ENCOUNTER — Encounter: Payer: Self-pay | Admitting: Emergency Medicine

## 2018-08-31 ENCOUNTER — Emergency Department: Payer: 59

## 2018-08-31 ENCOUNTER — Other Ambulatory Visit: Payer: Self-pay

## 2018-08-31 ENCOUNTER — Emergency Department
Admission: EM | Admit: 2018-08-31 | Discharge: 2018-08-31 | Disposition: A | Discharge status: Home / Self Care | Payer: 59 | Attending: Emergency Medicine | Admitting: Emergency Medicine

## 2018-08-31 DIAGNOSIS — F17210 Nicotine dependence, cigarettes, uncomplicated: Secondary | ICD-10-CM | POA: Insufficient documentation

## 2018-08-31 DIAGNOSIS — M79605 Pain in left leg: Secondary | ICD-10-CM | POA: Diagnosis present

## 2018-08-31 DIAGNOSIS — R609 Edema, unspecified: Secondary | ICD-10-CM | POA: Insufficient documentation

## 2018-08-31 DIAGNOSIS — I10 Essential (primary) hypertension: Secondary | ICD-10-CM | POA: Insufficient documentation

## 2018-08-31 DIAGNOSIS — Z79899 Other long term (current) drug therapy: Secondary | ICD-10-CM | POA: Insufficient documentation

## 2018-08-31 DIAGNOSIS — I2510 Atherosclerotic heart disease of native coronary artery without angina pectoris: Secondary | ICD-10-CM | POA: Diagnosis not present

## 2018-08-31 DIAGNOSIS — Z7982 Long term (current) use of aspirin: Secondary | ICD-10-CM | POA: Insufficient documentation

## 2018-08-31 DIAGNOSIS — Z7902 Long term (current) use of antithrombotics/antiplatelets: Secondary | ICD-10-CM | POA: Diagnosis not present

## 2018-08-31 DIAGNOSIS — R600 Localized edema: Secondary | ICD-10-CM

## 2018-08-31 NOTE — ED Notes (Signed)
Says she had swelling in both feet and ankles and the viens were sticking out since yesterday.  Also said hands were swelling.  She says she read that this was signs of a heart attack and [facility] clinic.  They sent her here. No chest pain.  Ankles do not appear to have edema and pedal pulses are strong bilaterally.  Color is normal.

## 2018-08-31 NOTE — Discharge Instructions (Signed)
Your ultrasound was negative for DVTs.  EKG showed normal abnormalities.  Advised to follow-up with his PCP for evaluation of your peripheral edema.

## 2018-08-31 NOTE — ED Notes (Signed)
ekg done.  Says she was not worred about heart attack, she was worried about a clot.  I told her that we needed to look at left calf that she said had pai.  She says she does not have any pain now.

## 2018-08-31 NOTE — ED Triage Notes (Signed)
[facility] for L back of leg discomfort x 2 days. States also noted veins dilated. Comes for eval for DVT.

## 2018-08-31 NOTE — ED Provider Notes (Signed)
[facility] Emergency Department Provider Note   ____________________________________________   First MD Initiated Contact with Patient 08/31/18 1255     (approximate)  I have reviewed the triage vital signs and the nursing notes.   HISTORY  Chief Complaint Leg Pain    HPI [name] is a 52 y.o. female patient comes in via wheelchair to the emergency room from the [place] clinic.  Patient states she noticed dilation of her leg veins.  Patient states she read somewhere on the Internet that this could be signs of a heart attack.  Patient went to urgent care clinic and I told her she thinks he had a heart attack she go to the emergency room the patient elected to go to [facility] clinic and they sent her straight to the emergency room for further evaluation.  Patient denies dyspnea or chest pain.  Patient states there was some discomfort in her leg but that has resolved prior to coming to the ED.  Past Medical History:  Diagnosis Date  . Bell's palsy   . Dental crown present    right front  . Deviated septum   . GERD (gastroesophageal reflux disease)   . Hearing loss   . Heart attack (HCC)    Dr. Welton Flakes  . Heart murmur   . Hypertension   . Hypertrophy of inferior nasal turbinate   . Ovarian cyst, right   . Sinus headache   . Sinusitis, chronic   . Thyroid disease     Patient Active Problem List   Diagnosis Date Noted  . CAD (coronary artery disease) 09/07/2014  . Essential hypertension, benign 05/19/2014  . Bell's palsy 05/12/2014  . Routine general medical examination at a health care facility 09/13/2013  . Tobacco use disorder 09/13/2013  . Anxiety 07/14/2012    Past Surgical History:  Procedure Laterality Date  . ABDOMINAL HYSTERECTOMY     partial  . CESAREAN SECTION    . ENDOSCOPIC CONCHA BULLOSA RESECTION Right 03/19/2016   Procedure: ENDOSCOPIC CONCHA BULLOSA RESECTION;  Surgeon: Geanie Logan, MD;  Location: [facility];   Service: ENT;  Laterality: Right;  . MAXILLARY ANTROSTOMY Bilateral 03/19/2016   Procedure: MAXILLARY ANTROSTOMY;  Surgeon: Geanie Logan, MD;  Location: [facility];  Service: ENT;  Laterality: Bilateral;  . NASAL TURBINATE REDUCTION Bilateral 03/19/2016   Procedure: TURBINATE REDUCTION/SUBMUCOSAL RESECTION;  Surgeon: Geanie Logan, MD;  Location: [facility];  Service: ENT;  Laterality: Bilateral;  . OOPHORECTOMY     Dr. Valentino Saxon  . SEPTOPLASTY N/A 03/19/2016   Procedure: SEPTOPLASTY;  Surgeon: Geanie Logan, MD;  Location: [facility];  Service: ENT;  Laterality: N/A;  GAVE DISK TO [facility] 3/27 (IF NEEDED)    Prior to Admission medications   Medication Sig Start Date End Date Taking? Authorizing Provider  ALPRAZolam (XANAX XR) 1 MG 24 hr tablet Take 1 mg by mouth daily. pm    [provider]  aspirin EC 81 MG tablet Take 81 mg by mouth daily. am    [provider]  atorvastatin (LIPITOR) 80 MG tablet Take 80 mg by mouth daily. am    [provider]  Biotin (BIOTIN MAXIMUM STRENGTH) 10 MG TABS Take 1 tablet by mouth daily.    [provider]  cefdinir (OMNICEF) 300 MG capsule 1 tablet twice daily for 10 days Patient not taking: Reported on 07/28/2018 03/19/16   Geanie Logan, MD  CLARITIN-D 12 HOUR 5-120 MG tablet TAKE 1 TABLET BY ORAL  ROUTE 2 TIMES PER DAY 05/14/17   [provider]  clopidogrel (PLAVIX) 75 MG tablet Take 75 mg by mouth daily.    [provider]  esomeprazole (NEXIUM) 40 MG capsule Take 40 mg by mouth daily at 12 noon. Reported on 03/12/2016    [provider]  HYDROcodone-acetaminophen (NORCO/VICODIN) 5-325 MG tablet Take 1-2 tablets by mouth every 4 (four) hours as needed for moderate pain. Patient not taking: Reported on 07/28/2018 03/19/16   Geanie LoganBennett, Paul, MD  isosorbide mononitrate (IMDUR) 30 MG 24 hr tablet Take 30 mg by mouth daily. am    [provider]  levothyroxine (SYNTHROID, LEVOTHROID)  75 MCG tablet Take 75 mcg by mouth daily. 05/31/17   [provider]  loratadine (CLARITIN) 10 MG tablet Take 10 mg by mouth daily as needed. 05/03/17   [provider]  metoprolol succinate (TOPROL-XL) 25 MG 24 hr tablet Take 25 mg by mouth daily. am    [provider]  pantoprazole (PROTONIX) 20 MG tablet Take 20 mg by mouth daily. am    [provider]  traZODone (DESYREL) 50 MG tablet Take 50 mg by mouth daily. 04/22/17   [provider]    Allergies No known allergies  Family History  Problem Relation Age of Onset  . Alcohol abuse Mother   . Hypertension Mother   . Heart failure Mother   . Alcohol abuse Father   . Stroke Father   . Hypertension Father   . Heart failure Father   . Parkinsonism Brother   . Asthma Brother   . Hypertension Sister   . Cancer Maternal Aunt     Social History Social History   Tobacco Use  . Smoking status: Current Every Day Smoker    Packs/day: 0.50    Years: 30.00    Pack years: 15.00    Types: Cigarettes  . Smokeless tobacco: Never Used  Substance Use Topics  . Alcohol use: No  . Drug use: No    Review of Systems Constitutional: No fever/chills Eyes: No visual changes. ENT: No sore throat. Cardiovascular: Denies chest pain. Respiratory: Denies shortness of breath. Gastrointestinal: No abdominal pain.  No nausea, no vomiting.  No diarrhea.  No constipation. Genitourinary: Negative for dysuria. Musculoskeletal: Negative for back pain. Skin: Negative for rash. Neurological: Negative for headaches, focal weakness or numbness. Psychiatric:Anxiety Endocrine:Hypertension and hypothyroidism  ____________________________________________   PHYSICAL EXAM:  VITAL SIGNS: ED Triage Vitals  Enc Vitals Group     BP 08/31/18 1019 (!) 145/100     Pulse Rate 08/31/18 1019 86     Resp 08/31/18 1019 20     Temp 08/31/18 1019 97.9 F (36.6 C)     Temp Source 08/31/18 1019 Oral     SpO2 08/31/18  1019 97 %     Weight 08/31/18 1020 118 lb (53.5 kg)     Height 08/31/18 1020 5\' 1"  (1.549 m)     Head Circumference --      Peak Flow --      Pain Score 08/31/18 1020 1     Pain Loc --      Pain Edu? --      Excl. in GC? --     Constitutional: Alert and oriented. Well appearing and in no acute distress. Neck: No stridor.  Hematological/Lymphatic/Immunilogical: No cervical lymphadenopathy. Cardiovascular: Normal rate, regular rhythm. Grossly normal heart sounds.  Good peripheral circulation.  No obvious venous distention.  Elevated blood pressure Respiratory: Normal respiratory effort.  No retractions. Lungs CTAB. Gastrointestinal: Soft and nontender. No distention. No abdominal bruits. No CVA tenderness. Musculoskeletal: No lower extremity tenderness nor edema.  No joint effusions. Neurologic:  Normal speech and language. No gross focal neurologic deficits are appreciated. No gait instability. Skin:  Skin is warm, dry and intact. No rash noted. Psychiatric: Mood and affect are normal. Speech and behavior are normal.  ____________________________________________   LABS (all labs ordered are listed, but only abnormal results are displayed)  Labs Reviewed - No data to display ____________________________________________  EKG  EKG read by heart station ducted with no acute findings. ____________________________________________  RADIOLOGY  ED MD interpretation:   Official radiology report(s): US Venous Img Lower Unilateral Left  Result Date: 08/31/2018 CLINICAL DATA:  Left leg pain EXAM: LEFT LOWER EXTREMITY VENOUS DOPPLER ULTRASOUND TECHNIQUE: Gray-scale sonography with graded compression, as well as color Doppler and duplex ultrasound were performed to evaluate the lower extremity deep venous systems from the level of the common femoral vein and including the common femoral, femoral, profunda femoral, popliteal and calf veins including the posterior tibial, peroneal and  gastrocnemius veins when visible. The superficial great saphenous vein was also interrogated. Spectral Doppler was utilized to evaluate flow at rest and with distal augmentation maneuvers in the common femoral, femoral and popliteal veins. COMPARISON:  None. FINDINGS: Contralateral Common Femoral Vein: Respiratory phasicity is normal and symmetric with the symptomatic side. No evidence of thrombus. Normal compressibility. Common Femoral Vein: No evidence of thrombus. Normal compressibility, respiratory phasicity and response to augmentation. Saphenofemoral Junction: No evidence of thrombus. Normal compressibility and flow on color Doppler imaging. Profunda Femoral Vein: No evidence of thrombus. Normal compressibility and flow on color Doppler imaging. Femoral Vein: No evidence of thrombus. Normal compressibility, respiratory phasicity and response to augmentation. Popliteal Vein: No evidence of thrombus. Normal compressibility, respiratory phasicity and response to augmentation. Calf Veins: No evidence of thrombus. Normal compressibility and flow on color Doppler imaging. Superficial Great Saphenous Vein: No evidence of thrombus. Normal compressibility. Venous Reflux:  None. Other Findings:  None. IMPRESSION: No evidence of deep venous thrombosis. Electronically Signed   By: Alcide Clever M.D.   On: 08/31/2018 11:27    ____________________________________________   PROCEDURES  Procedure(s) performed: None  Procedures  Critical Care performed: No  ____________________________________________   INITIAL IMPRESSION / ASSESSMENT AND PLAN / ED COURSE  As part of my medical decision making, I reviewed the following data within the electronic MEDICAL RECORD NUMBER    Patient reported to urgent care in this ED secondary to peripheral edema which she thought would lead to a heart attack.  And no time the patient complain of chest pain or dyspnea.  Ultrasound was negative for DVTs.  EKG read by heart station  with no acute findings.  Patient states no pain at this time.  Advised patient to follow-up PCP for further evaluation for peripheral edema.  Continue previous medications.      ____________________________________________   FINAL CLINICAL IMPRESSION(S) / ED DIAGNOSES  Final diagnoses:  Mild peripheral edema     ED Discharge Orders    None       Note:  This document was prepared using Dragon voice recognition software and may include unintentional dictation errors.    Joni Reining, PA-C 08/31/18 1348    Jene Every, MD 08/31/18 [phone]

## 2019-07-07 ENCOUNTER — Encounter: Payer: Self-pay | Admitting: Obstetrics and Gynecology

## 2019-07-07 DIAGNOSIS — Z1231 Encounter for screening mammogram for malignant neoplasm of breast: Secondary | ICD-10-CM | POA: Diagnosis not present

## 2019-07-29 DIAGNOSIS — I2510 Atherosclerotic heart disease of native coronary artery without angina pectoris: Secondary | ICD-10-CM | POA: Diagnosis not present

## 2019-07-29 DIAGNOSIS — F419 Anxiety disorder, unspecified: Secondary | ICD-10-CM | POA: Diagnosis not present

## 2019-07-29 DIAGNOSIS — R12 Heartburn: Secondary | ICD-10-CM | POA: Diagnosis not present

## 2019-07-29 DIAGNOSIS — I10 Essential (primary) hypertension: Secondary | ICD-10-CM | POA: Diagnosis not present

## 2019-08-27 ENCOUNTER — Encounter: Payer: Self-pay | Admitting: Family Medicine

## 2019-08-27 ENCOUNTER — Ambulatory Visit (INDEPENDENT_AMBULATORY_CARE_PROVIDER_SITE_OTHER): Payer: BC Managed Care – PPO | Admitting: Family Medicine

## 2019-08-27 ENCOUNTER — Other Ambulatory Visit: Payer: Self-pay

## 2019-08-27 VITALS — BP 138/86 | HR 88 | Temp 97.1°F | Resp 18 | Ht 61.0 in | Wt 127.8 lb

## 2019-08-27 DIAGNOSIS — R202 Paresthesia of skin: Secondary | ICD-10-CM | POA: Diagnosis not present

## 2019-08-27 DIAGNOSIS — R200 Anesthesia of skin: Secondary | ICD-10-CM | POA: Diagnosis not present

## 2019-08-27 DIAGNOSIS — Z23 Encounter for immunization: Secondary | ICD-10-CM

## 2019-08-27 DIAGNOSIS — G4700 Insomnia, unspecified: Secondary | ICD-10-CM

## 2019-08-27 MED ORDER — TRAZODONE HCL 50 MG PO TABS: 100.0000 mg | ORAL_TABLET | Freq: Every evening | ORAL | 2 refills | Status: DC | PRN

## 2019-08-27 MED ORDER — MELATONIN 3 MG PO TABS: 1.0000 | ORAL_TABLET | Freq: Every day | ORAL | 2 refills | Status: DC | PRN

## 2019-08-27 NOTE — Progress Notes (Signed)
Subjective:    Patient ID: [name], female    DOB: 1965/12/24, 53 y.o.   MRN: [id number]  HPI   Patient presents to clinic to reestablish with PCP.  She last came to the office about 4 to 5 years ago and saw Dr. Gilford Rile.  She had been seeing a different family physician, but is now living back in the [place] area and wants to reestablish care.  Past medical history reviewed and updated accordingly in chart.  She does see cardiology regularly for her coronary artery disease follow-up.  She is tolerating all of her blood pressure, cardiac meds without any problems.  Tolerating Protonix for GERD without any issues.  Tolerating statin without any abdominal pains or muscle aches.  Energy level feels good, tolerating levothyroxine without problems.  Had lab work at her previous PCP about 2 months ago.  Per patient everything was normal.  We will be sure to request his records.  Patient reports a sensation of numbness and tingling off and on in left upper extremity and also around the lips for 3 to 4 weeks.  Denies any recent head injury or neck injury.  Sometimes will notice tougher time gripping with left hand due to tingling, but then tingling will go away and her grip strength is normal.  When feels tingling around the lips, states she is able to form words and speak normally, she is able to chew and drink normally just has the tingling sensation.  Also has insomnia.  States she struggled with this off and on for years.  Has taken Xanax previously, but does not want to be on this long-term.  Also states she was on a low-dose trazodone but not sure if she was on a high enough dose.  Past Medical History:  Diagnosis Date  . Bell's palsy   . Dental crown present    right front  . Deviated septum   . GERD (gastroesophageal reflux disease)   . Hearing loss   . Heart attack ([place])    Dr. Humphrey Rolls  . Heart murmur   . Hypertension   . Hypertrophy of inferior nasal turbinate   . Ovarian  cyst, right   . Sinus headache   . Sinusitis, chronic   . Thyroid disease    Past Surgical History:  Procedure Laterality Date  . ABDOMINAL HYSTERECTOMY     partial  . CESAREAN SECTION    . ENDOSCOPIC CONCHA BULLOSA RESECTION Right 03/19/2016   Procedure: ENDOSCOPIC CONCHA BULLOSA RESECTION;  Surgeon: Clyde Canterbury, MD;  Location: [place];  Service: ENT;  Laterality: Right;  . MAXILLARY ANTROSTOMY Bilateral 03/19/2016   Procedure: MAXILLARY ANTROSTOMY;  Surgeon: Clyde Canterbury, MD;  Location: [place];  Service: ENT;  Laterality: Bilateral;  . NASAL TURBINATE REDUCTION Bilateral 03/19/2016   Procedure: TURBINATE REDUCTION/SUBMUCOSAL RESECTION;  Surgeon: Clyde Canterbury, MD;  Location: [place];  Service: ENT;  Laterality: Bilateral;  . OOPHORECTOMY     Dr. Marcelline Mates  . SEPTOPLASTY N/A 03/19/2016   Procedure: SEPTOPLASTY;  Surgeon: Clyde Canterbury, MD;  Location: [place];  Service: ENT;  Laterality: N/A;  GAVE DISK TO [facility] 3/27 (IF NEEDED)   Family History  Problem Relation Age of Onset  . Alcohol abuse Mother   . Hypertension Mother   . Heart failure Mother   . Alcohol abuse Father   . Stroke Father   . Hypertension Father   . Heart failure Father   . Parkinsonism Brother   .  Asthma Brother   . Hypertension Sister   . Cancer Maternal Aunt    Social History   Tobacco Use  . Smoking status: Current Every Day Smoker    Packs/day: 0.50    Years: 30.00    Pack years: 15.00    Types: Cigarettes  . Smokeless tobacco: Never Used  Substance Use Topics  . Alcohol use: No     Review of Systems  Constitutional: Negative for chills, fatigue and fever.  HENT: Negative for congestion, ear pain, sinus pain and sore throat.   Eyes: Negative.   Respiratory: Negative for cough, shortness of breath and wheezing.   Cardiovascular: Negative for chest pain, palpitations and leg swelling.  Gastrointestinal: Negative for abdominal pain, diarrhea, nausea and  vomiting.  Genitourinary: Negative for dysuria, frequency and urgency.  Musculoskeletal: Negative for arthralgias and myalgias.  Skin: Negative for color change, pallor and rash.  Neurological: Negative for syncope, light-headedness and headaches. +numbness in left arm and around lips off and on Psychiatric/Behavioral: The patient is not nervous/anxious. +insomnia       Objective:   Physical Exam Vitals signs and nursing note reviewed.  Constitutional:      General: She is not in acute distress.    Appearance: She is not ill-appearing, toxic-appearing or diaphoretic.  HENT:     Head: Normocephalic and atraumatic.     Right Ear: Tympanic membrane, ear canal and external ear normal.     Left Ear: Tympanic membrane, ear canal and external ear normal.  Eyes:     General: No scleral icterus.       Right eye: No discharge.        Left eye: No discharge.     Extraocular Movements: Extraocular movements intact.     Conjunctiva/sclera: Conjunctivae normal.     Pupils: Pupils are equal, round, and reactive to light.  Neck:     Musculoskeletal: Normal range of motion and neck supple. No neck rigidity.     Vascular: No carotid bruit.  Cardiovascular:     Rate and Rhythm: Normal rate and regular rhythm.  Pulmonary:     Effort: Pulmonary effort is normal.     Breath sounds: Normal breath sounds.  Musculoskeletal:     Right lower leg: No edema.     Left lower leg: No edema.  Skin:    General: Skin is warm and dry.     Capillary Refill: Capillary refill takes less than 2 seconds.     Coloration: Skin is not jaundiced or pale.  Neurological:     General: No focal deficit present.     Mental Status: She is alert and oriented to person, place, and time.     Cranial Nerves: No cranial nerve deficit.     Sensory: No sensory deficit.     Motor: No weakness.     Coordination: Coordination normal.     Gait: Gait normal.     Deep Tendon Reflexes: Reflexes normal.     Comments: Grips equal and  strong.  Dorsiplantarflexion equal and strong.  Smile symmetrical.  Cannot raise eyebrows, puff out cheeks and clenched teeth without issues.  Psychiatric:        Mood and Affect: Mood normal.        Behavior: Behavior normal.        Thought Content: Thought content normal.        Judgment: Judgment normal.    Today's Vitals   08/27/19 1320  BP: 138/86  Pulse: 88  Resp: 18  Temp: (!) 97.1 F (36.2 C)  TempSrc: Temporal  SpO2: 96%  Weight: 127 lb 12.8 oz (58 kg)  Height: 5\' 1"  (1.549 m)   Body mass index is 24.15 kg/m.     Assessment & Plan:    1. Insomnia, unspecified type  Patient will try a 100 mg in the known and also 3 mg melatonin at bedtime to help assist with sleep.  Discussed good sleep hygiene with wind down routine including not using cell phone or watching TV 30 minutes before bed, gets laying in a dark room and having area quiet to help initiate good sleep.  - traZODone (DESYREL) 50 MG tablet; Take 2 tablets (100 mg total) by mouth at bedtime as needed for sleep.  Dispense: 30 tablet; Refill: 2 - Melatonin 3 MG TABS; Take 1 tablet (3 mg total) by mouth daily as needed.  Dispense: 30 tablet; Refill: 2  2. Numbness and tingling in left arm  Unclear reason for this.  Could be related to neck issue.  We will get MRI of both neck and spine and refer to neurology for further work-up.  - MR Brain Wo Contrast; Future - MR Cervical Spine Wo Contrast; Future - Ambulatory referral to Neurology  3. Numbness of lip  Unclear reason for this.  Could be related to neck issue.  We will get MRI of both neck and spine and refer to neurology for further work-up.  - MR Brain Wo Contrast; Future - MR Cervical Spine Wo Contrast; Future - Ambulatory referral to Neurology  4. Need for immunization against influenza  - Flu Vaccine QUAD 36+ mos IM   We will have patient follow-up in about 4 weeks for recheck after starting trazodone.  She is aware someone will contact her with  MRI appointments.  We will get her most recent lab work from the past PCP and see if anything else needs to be ordered for regular follow-up.

## 2019-09-01 ENCOUNTER — Telehealth: Payer: Self-pay | Admitting: Family Medicine

## 2019-09-01 DIAGNOSIS — R200 Anesthesia of skin: Secondary | ICD-10-CM

## 2019-09-01 DIAGNOSIS — R202 Paresthesia of skin: Secondary | ICD-10-CM

## 2019-09-01 NOTE — Telephone Encounter (Signed)
Please let patient know insurance will not approve MRI neck now  So we can do C-spine (neck) xray first  I will order  LG

## 2019-09-02 NOTE — Telephone Encounter (Signed)
Called Pt No answer left VM, will try back later.  

## 2019-09-14 DIAGNOSIS — R200 Anesthesia of skin: Secondary | ICD-10-CM | POA: Diagnosis not present

## 2019-09-14 DIAGNOSIS — R202 Paresthesia of skin: Secondary | ICD-10-CM | POA: Diagnosis not present

## 2019-09-18 ENCOUNTER — Other Ambulatory Visit: Payer: Self-pay

## 2019-09-18 ENCOUNTER — Ambulatory Visit
Admission: RE | Admit: 2019-09-18 | Discharge: 2019-09-18 | Disposition: A | Discharge status: Home / Self Care | Payer: BC Managed Care – PPO | Source: Ambulatory Visit | Attending: Family Medicine | Admitting: Family Medicine

## 2019-09-18 DIAGNOSIS — R202 Paresthesia of skin: Secondary | ICD-10-CM | POA: Diagnosis not present

## 2019-09-18 DIAGNOSIS — R200 Anesthesia of skin: Secondary | ICD-10-CM | POA: Diagnosis not present

## 2019-09-20 ENCOUNTER — Other Ambulatory Visit: Payer: Self-pay | Admitting: Family Medicine

## 2019-09-20 DIAGNOSIS — G4700 Insomnia, unspecified: Secondary | ICD-10-CM

## 2019-09-23 ENCOUNTER — Other Ambulatory Visit: Payer: Self-pay

## 2019-09-24 ENCOUNTER — Other Ambulatory Visit: Payer: Self-pay

## 2019-09-28 ENCOUNTER — Ambulatory Visit (INDEPENDENT_AMBULATORY_CARE_PROVIDER_SITE_OTHER): Payer: BC Managed Care – PPO | Admitting: Family Medicine

## 2019-09-28 ENCOUNTER — Other Ambulatory Visit: Payer: Self-pay

## 2019-09-28 ENCOUNTER — Encounter: Payer: Self-pay | Admitting: Family Medicine

## 2019-09-28 VITALS — BP 122/70 | HR 84 | Temp 97.7°F | Wt 130.0 lb

## 2019-09-28 DIAGNOSIS — G4700 Insomnia, unspecified: Secondary | ICD-10-CM | POA: Diagnosis not present

## 2019-09-28 DIAGNOSIS — R200 Anesthesia of skin: Secondary | ICD-10-CM | POA: Diagnosis not present

## 2019-09-28 DIAGNOSIS — R202 Paresthesia of skin: Secondary | ICD-10-CM | POA: Diagnosis not present

## 2019-09-28 NOTE — Progress Notes (Signed)
Subjective:    Patient ID: [name], female    DOB: Apr 21, 1966, 53 y.o.   MRN: [id number]  HPI   Patient presents to clinic for follow-up on tingling in the lip and left arm and also insomnia.  Continues to have sensation of tingling in left and left arm off and on.  MRI of brain was performed, this is negative for any acute issue.  She does have appointment with neurology coming up the end of October.  MRI of neck was not approved by insurance, discussed doing a x-ray to further investigate tingling in left arm, but patient declines and would like to wait until neurology appointment at this time.  Continues to struggle with insomnia.  States the trazodone 100 mg does help some, but will usually wake up after 4 hours.  Uses melatonin with the trazodone as well.  Tries to have a wind down routine, but still at most gets 4 hours of sleep at a time.  Patient Active Problem List   Diagnosis Date Noted  . CAD (coronary artery disease) 09/07/2014  . Essential hypertension, benign 05/19/2014  . Bell's palsy 05/12/2014  . Routine general medical examination at a health care facility 09/13/2013  . Tobacco use disorder 09/13/2013  . Anxiety 07/14/2012   Social History   Tobacco Use  . Smoking status: Current Every Day Smoker    Packs/day: 0.50    Years: 30.00    Pack years: 15.00    Types: Cigarettes  . Smokeless tobacco: Never Used  Substance Use Topics  . Alcohol use: No     Review of Systems  Constitutional: Negative for chills, fatigue and fever.  HENT: Negative for congestion, ear pain, sinus pain and sore throat.   Eyes: Negative.   Respiratory: Negative for cough, shortness of breath and wheezing.   Cardiovascular: Negative for chest pain, palpitations and leg swelling.  Gastrointestinal: Negative for abdominal pain, diarrhea, nausea and vomiting.  Genitourinary: Negative for dysuria, frequency and urgency.  Musculoskeletal: Negative for arthralgias and myalgias.   Skin: Negative for color change, pallor and rash.  Neurological: Negative for syncope, light-headedness and headaches.  Psychiatric/Behavioral: The patient is not nervous/anxious.       Objective:   Physical Exam Vitals signs and nursing note reviewed.  Constitutional:      General: She is not in acute distress.    Appearance: She is not ill-appearing or toxic-appearing.  HENT:     Head: Normocephalic and atraumatic.  Eyes:     General: No scleral icterus.    Extraocular Movements: Extraocular movements intact.     Conjunctiva/sclera: Conjunctivae normal.     Pupils: Pupils are equal, round, and reactive to light.  Neck:     Musculoskeletal: Normal range of motion and neck supple. No neck rigidity.  Cardiovascular:     Rate and Rhythm: Normal rate and regular rhythm.  Pulmonary:     Effort: Pulmonary effort is normal. No respiratory distress.     Breath sounds: Normal breath sounds.  Musculoskeletal:     Right lower leg: No edema.     Left lower leg: No edema.  Lymphadenopathy:     Cervical: No cervical adenopathy.  Skin:    General: Skin is warm and dry.     Coloration: Skin is not jaundiced or pale.     Findings: No erythema.  Neurological:     General: No focal deficit present.     Mental Status: She is alert and oriented to person,  place, and time.     Gait: Gait normal.     Comments: Grips equal and strong.  Smile symmetrical.  Can raise eyebrows, puff out cheeks and clenched teeth without issues.  Psychiatric:        Mood and Affect: Mood normal.        Behavior: Behavior normal.        Thought Content: Thought content normal.        Judgment: Judgment normal.    Today's Vitals   09/28/19 1344  BP: 122/70  Pulse: 84  Temp: 97.7 F (36.5 C)  SpO2: 97%  Weight: 130 lb (59 kg)   Body mass index is 24.56 kg/m.     Assessment & Plan:    1. Insomnia, unspecified type Patient will increase trazodone 150 mg at bedtime and monitor how she progresses.   Advised she can also increase dose of melatonin from 3 mg to 6 mg and see if this helps.  Again reviewed good sleep hygiene practices and having a wind down routine, avoiding TV or cell phone at night laying in dark room and quiet.  2. Numbness of lip Patient will keep appointment with neurology as planned.  MRI brain is unremarkable  3. Numbness and tingling in left arm Patient will keep appoint with neurology as planned.  MRI brain is unremarkable.  Declines doing x-ray of neck at this time until sees neurology.   We will plan to make next follow-up appointment after patient sees neurology.  Advised patient she should follow-up in about 4 to 6 weeks for reeval after trazodone increase, states she will call and let us know what works with her work schedule after having her neurology appointment.

## 2019-10-12 DIAGNOSIS — R200 Anesthesia of skin: Secondary | ICD-10-CM | POA: Diagnosis not present

## 2019-10-12 DIAGNOSIS — R202 Paresthesia of skin: Secondary | ICD-10-CM | POA: Diagnosis not present

## 2020-04-18 ENCOUNTER — Ambulatory Visit (INDEPENDENT_AMBULATORY_CARE_PROVIDER_SITE_OTHER): Payer: BC Managed Care – PPO | Admitting: Internal Medicine

## 2020-04-18 ENCOUNTER — Other Ambulatory Visit: Payer: Self-pay

## 2020-04-18 ENCOUNTER — Encounter: Payer: Self-pay | Admitting: Internal Medicine

## 2020-04-18 VITALS — BP 131/78 | HR 82 | Wt 131.0 lb

## 2020-04-18 DIAGNOSIS — Z0000 Encounter for general adult medical examination without abnormal findings: Secondary | ICD-10-CM

## 2020-04-18 DIAGNOSIS — I2510 Atherosclerotic heart disease of native coronary artery without angina pectoris: Secondary | ICD-10-CM | POA: Diagnosis not present

## 2020-04-18 DIAGNOSIS — I10 Essential (primary) hypertension: Secondary | ICD-10-CM

## 2020-04-18 MED ORDER — CLOPIDOGREL BISULFATE 75 MG PO TABS: 75.0000 mg | ORAL_TABLET | Freq: Every day | ORAL | 3 refills | Status: DC

## 2020-04-18 MED ORDER — LORATADINE 10 MG PO TABS: 10.0000 mg | ORAL_TABLET | Freq: Every day | ORAL | 3 refills | Status: DC

## 2020-04-18 NOTE — Patient Instructions (Signed)
Allergies, Adult An allergy means that your body reacts to something that bothers it (allergen). It is not a normal reaction. This can happen from something that you:  Eat.  Breathe in.  Touch. You can have an allergy (be allergic) to:  Outdoor things, like: ? Pollen. ? Grass. ? Weeds.  Indoor things, like: ? Dust. ? Smoke. ? Pet dander.  Foods.  Medicines.  Things that bother your skin, like: ? Detergents. ? Chemicals. ? Latex.  Perfume.  Bugs. An allergy cannot spread from person to person (is not contagious). Follow these instructions at home:         Stay away from things that you know you are allergic to.  If you have allergies to things in the air, wash out your nose each day. Do it with one of these: ? A salt-water (saline) spray. ? A container (neti pot).  Take over-the-counter and prescription medicines only as told by your doctor.  Keep all follow-up visits as told by your doctor. This is important.  If you are at risk for a very bad allergy reaction (anaphylaxis), keep an auto-injector with you all the time. This is called an epinephrine injection. ? This is pre-measured medicine with a needle. You can put it into your skin by yourself. ? Right after you have a very bad allergy reaction, you or a person with you must give the medicine in less than a few minutes. This is an emergency.  If you have ever had a very bad allergy reaction, wear a medical alert bracelet or necklace. Your very bad allergy should be written on it. Contact a health care provider if:  Your symptoms do not get better with treatment. Get help right away if:  You have symptoms of a very bad allergy reaction. These include: ? A swollen mouth, tongue, or throat. ? Pain or tightness in your chest. ? Trouble breathing. ? Being short of breath. ? Dizziness. ? Fainting. ? Very bad pain in your belly (abdomen). ? Throwing up (vomiting). ? Watery poop  (diarrhea). Summary  An allergy means that your body reacts to something that bothers it (allergen). It is not a normal reaction.  Stay away from things that make your body react.  Take over-the-counter and prescription medicines only as told by your doctor.  If you are at risk for a very bad allergy reaction, carry an auto-injector (epinephrine injection) all the time. Also, wear a medical alert bracelet or necklace so people know about your allergy. This information is not intended to replace advice given to you by your health care provider. Make sure you discuss any questions you have with your health care provider. Document Revised: 03/23/2019 Document Reviewed: 03/17/2017 Elsevier Patient Education  2020 Elsevier Inc.  

## 2020-04-18 NOTE — Progress Notes (Signed)
Established Patient Office Visit  Subjective:  Patient ID: [name], female    DOB: February 13, 1966  Age: 54 y.o. MRN: [id number]  CC:  Chief Complaint  Patient presents with  . Medication Refill    loraztadine and plavix     Pt denies any chest pain , h/o heart attack  In past  Medication Refill This is a chronic problem. Associated symptoms include congestion. Pertinent negatives include no joint swelling or myalgias. The symptoms are aggravated by sneezing.  URI  The current episode started in the past 7 days. The problem has been unchanged. There has been no fever. Associated symptoms include congestion, rhinorrhea, sinus pain and sneezing. She has tried antihistamine and decongestant for the symptoms. The treatment provided moderate relief.    Cassia Langley Adie presents for sneezing  and rhinitis  Past Medical History:  Diagnosis Date  . Bell's palsy   . Dental crown present    right front  . Deviated septum   . GERD (gastroesophageal reflux disease)   . Hearing loss   . Heart attack ([place])    Dr. Humphrey Rolls  . Heart murmur   . Hypertension   . Hypertrophy of inferior nasal turbinate   . Ovarian cyst, right   . Sinus headache   . Sinusitis, chronic   . Thyroid disease     Past Surgical History:  Procedure Laterality Date  . ABDOMINAL HYSTERECTOMY     partial  . CESAREAN SECTION    . ENDOSCOPIC CONCHA BULLOSA RESECTION Right 03/19/2016   Procedure: ENDOSCOPIC CONCHA BULLOSA RESECTION;  Surgeon: Clyde Canterbury, MD;  Location: [place];  Service: ENT;  Laterality: Right;  . MAXILLARY ANTROSTOMY Bilateral 03/19/2016   Procedure: MAXILLARY ANTROSTOMY;  Surgeon: Clyde Canterbury, MD;  Location: [facility];  Service: ENT;  Laterality: Bilateral;  . NASAL TURBINATE REDUCTION Bilateral 03/19/2016   Procedure: TURBINATE REDUCTION/SUBMUCOSAL RESECTION;  Surgeon: Clyde Canterbury, MD;  Location: [facility];  Service: ENT;  Laterality: Bilateral;  . OOPHORECTOMY       Dr. Marcelline Mates  . SEPTOPLASTY N/A 03/19/2016   Procedure: SEPTOPLASTY;  Surgeon: Clyde Canterbury, MD;  Location: [facility];  Service: ENT;  Laterality: N/A;  GAVE DISK TO [facility] 3/27 (IF NEEDED)    Family History  Problem Relation Age of Onset  . Hypertension Mother   . Heart failure Mother   . Alcohol abuse Father   . Stroke Father   . Hypertension Father   . Heart failure Father   . Parkinsonism Brother   . Asthma Brother   . Hypertension Sister   . Cancer Maternal Aunt     Social History   Socioeconomic History  . Marital status: Single    Spouse name: Not on file  . Number of children: Not on file  . Years of education: Not on file  . Highest education level: Not on file  Occupational History  . Not on file  Tobacco Use  . Smoking status: Current Every Day Smoker    Packs/day: 0.50    Years: 30.00    Pack years: 15.00    Types: Cigarettes  . Smokeless tobacco: Never Used  Substance and Sexual Activity  . Alcohol use: No  . Drug use: No  . Sexual activity: Yes    Birth control/protection: None    Comment: hysterctomy  Other Topics Concern  . Not on file  Social History Narrative   Lives in [place]. 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  Diet - healthy   Exercise - none   Social Determinants of Health   Financial Resource Strain:   . Difficulty of Paying Living Expenses:   Food Insecurity:   . Worried About Programme researcher, broadcasting/film/video in the Last Year:   . Barista in the Last Year:   Transportation Needs:   . Freight forwarder (Medical):   Marland Kitchen Lack of Transportation (Non-Medical):   Physical Activity:   . Days of Exercise per Week:   . Minutes of Exercise per Session:   Stress:   . Feeling of Stress :   Social Connections:   . Frequency of Communication with Friends and Family:   . Frequency of Social Gatherings with Friends and Family:   . Attends Religious Services:   . Active Member of Clubs or Organizations:   . Attends Banker  Meetings:   Marland Kitchen Marital Status:   Intimate Partner Violence:   . Fear of Current or Ex-Partner:   . Emotionally Abused:   Marland Kitchen Physically Abused:   . Sexually Abused:     Outpatient Medications Prior to Visit  Medication Sig Dispense Refill  . aspirin EC 81 MG tablet Take 81 mg by mouth daily. am    . atorvastatin (LIPITOR) 80 MG tablet Take 80 mg by mouth daily. am    . isosorbide mononitrate (IMDUR) 30 MG 24 hr tablet Take 30 mg by mouth daily. am    . levothyroxine (SYNTHROID, LEVOTHROID) 75 MCG tablet Take 75 mcg by mouth daily.  3  . metoprolol succinate (TOPROL-XL) 25 MG 24 hr tablet Take 25 mg by mouth daily. am    . naproxen sodium (ALEVE) 220 MG tablet Take 220 mg by mouth.    . pantoprazole (PROTONIX) 20 MG tablet Take 20 mg by mouth daily. am    . clopidogrel (PLAVIX) 75 MG tablet Take 75 mg by mouth daily.    Marland Kitchen loratadine (CLARITIN) 10 MG tablet Take 10 mg by mouth daily.    . Melatonin 3 MG TABS Take 1 tablet (3 mg total) by mouth daily as needed. 30 tablet 2  . traZODone (DESYREL) 50 MG tablet TAKE 2 TABLETS (100 MG TOTAL) BY MOUTH AT BEDTIME AS NEEDED FOR SLEEP. 60 tablet 1   No facility-administered medications prior to visit.    Allergies  Allergen Reactions  . No Known Allergies     ROS Review of Systems  Constitutional: Negative.   HENT: Positive for congestion, postnasal drip, rhinorrhea, sinus pain and sneezing.   Gastrointestinal: Negative for constipation.  Musculoskeletal: Negative for joint swelling and myalgias.  Allergic/Immunologic: Positive for environmental allergies.  Psychiatric/Behavioral: Negative for behavioral problems. The patient is not hyperactive.       Objective:    Physical Exam  Constitutional: She is oriented to person, place, and time. She appears well-developed.  HENT:  Head: Normocephalic.  Eyes: Pupils are equal, round, and reactive to light.  Neck: No JVD present. No tracheal deviation present. Thyromegaly present.    Cardiovascular: Normal rate.  No murmur heard. Pulmonary/Chest: No respiratory distress. She has no wheezes. She has no rales. She exhibits no tenderness.  Abdominal: She exhibits mass. She exhibits no distension. There is no abdominal tenderness.  Musculoskeletal:        General: No edema.     Cervical back: Normal range of motion and neck supple.  Lymphadenopathy:    She has cervical adenopathy.  Neurological: She is alert and oriented to person, place,  and time. No cranial nerve deficit.  Psychiatric: She has a normal mood and affect.    BP 131/78   Pulse 82   Wt 131 lb (59.4 kg)   BMI 24.75 kg/m  Wt Readings from Last 3 Encounters:  04/18/20 131 lb (59.4 kg)  09/28/19 130 lb (59 kg)  09/18/19 127 lb (57.6 kg)     Health Maintenance Due  Topic Date Due  . COVID-19 Vaccine (1) Never done  . COLONOSCOPY  Never done  . PAP SMEAR-Modifier  09/13/2016  . TETANUS/TDAP  12/17/2019    There are no preventive care reminders to display for this patient.  Lab Results  Component Value Date   TSH 0.996 07/16/2014   Lab Results  Component Value Date   WBC 11.3 (H) 07/16/2014   HGB 13.3 07/16/2014   HCT 40.8 07/16/2014   MCV 94 07/16/2014   PLT 214 07/16/2014   Lab Results  Component Value Date   NA 142 07/16/2014   K 3.6 07/16/2014   CO2 26 07/16/2014   GLUCOSE 96 07/16/2014   BUN 8 07/16/2014   CREATININE 0.73 07/16/2014   BILITOT 0.4 07/16/2014   ALKPHOS 66 07/16/2014   AST 55 (H) 07/16/2014   ALT 24 07/16/2014   PROT 6.4 07/16/2014   ALBUMIN 3.3 (L) 07/16/2014   CALCIUM 8.6 07/16/2014   ANIONGAP 6 (L) 07/16/2014   GFR 96.19 09/13/2013   Lab Results  Component Value Date   CHOL 162 07/16/2014   Lab Results  Component Value Date   HDL 27 (L) 07/16/2014   Lab Results  Component Value Date   LDLCALC 86 07/16/2014   Lab Results  Component Value Date   TRIG 243 (H) 07/16/2014   Lab Results  Component Value Date   CHOLHDL 5 09/13/2013   No  results found for: HGBA1C    Assessment & Plan:   Problem List Items Addressed This Visit      Cardiovascular and Mediastinum   Essential hypertension, benign   CAD (coronary artery disease)     Other   Routine general medical examination at a health care facility - Primary      Meds ordered this encounter  Medications  . clopidogrel (PLAVIX) 75 MG tablet    Sig: Take 1 tablet (75 mg total) by mouth daily.    Dispense:  90 tablet    Refill:  3  . loratadine (CLARITIN) 10 MG tablet    Sig: Take 1 tablet (10 mg total) by mouth daily.    Dispense:  90 tablet    Refill:  3    Follow-up: Return in about 4 months (around 08/19/2020).    Corky Downs, MD

## 2020-07-05 DIAGNOSIS — Z1231 Encounter for screening mammogram for malignant neoplasm of breast: Secondary | ICD-10-CM | POA: Diagnosis not present

## 2020-08-24 ENCOUNTER — Other Ambulatory Visit: Payer: Self-pay

## 2020-08-24 ENCOUNTER — Encounter: Payer: Self-pay | Admitting: Internal Medicine

## 2020-08-24 ENCOUNTER — Ambulatory Visit (INDEPENDENT_AMBULATORY_CARE_PROVIDER_SITE_OTHER): Payer: BC Managed Care – PPO | Admitting: Internal Medicine

## 2020-08-24 VITALS — BP 159/101 | HR 93 | Ht 61.0 in | Wt 132.2 lb

## 2020-08-24 DIAGNOSIS — E063 Autoimmune thyroiditis: Secondary | ICD-10-CM

## 2020-08-24 DIAGNOSIS — E038 Other specified hypothyroidism: Secondary | ICD-10-CM | POA: Diagnosis not present

## 2020-08-24 DIAGNOSIS — I10 Essential (primary) hypertension: Secondary | ICD-10-CM

## 2020-08-24 DIAGNOSIS — I2510 Atherosclerotic heart disease of native coronary artery without angina pectoris: Secondary | ICD-10-CM

## 2020-08-24 DIAGNOSIS — F419 Anxiety disorder, unspecified: Secondary | ICD-10-CM

## 2020-08-24 MED ORDER — ALPRAZOLAM 0.25 MG PO TABS: 0.2500 mg | ORAL_TABLET | Freq: Every evening | ORAL | 0 refills | Status: AC | PRN

## 2020-08-24 NOTE — Assessment & Plan Note (Signed)
Stable at the present time. Will check labs in 4 months.

## 2020-08-24 NOTE — Assessment & Plan Note (Signed)
-   Patient experiencing high levels of anxiety.  - Encouraged patient to engage in relaxing activities like yoga, meditation, journaling, going for a walk, or participating in a hobby.  - Encouraged patient to reach out to trusted friends or family members about recent struggles 

## 2020-08-24 NOTE — Progress Notes (Signed)
Established Patient Office Visit  SUBJECTIVE:  Subjective  Patient ID: [name], female    DOB: 09/16/1966  Age: 54 y.o. MRN: [id number]  CC:  Chief Complaint  Patient presents with  . Anxiety    patient having anxiety since coming off xanax     HPI [name] is a 54 y.o. female presenting today for anxiety follow up.  Her blood pressure today is 159/101. She notes that she has been stressed recently due to work. She denies chest pain, palpitations, and shortness of breath.    Past Medical History:  Diagnosis Date  . Bell's palsy   . Dental crown present    right front  . Deviated septum   . GERD (gastroesophageal reflux disease)   . Hearing loss   . Heart attack ([facility])    Dr. Welton FlakesKhan  . Heart murmur   . Hypertension   . Hypertrophy of inferior nasal turbinate   . Ovarian cyst, right   . Sinus headache   . Sinusitis, chronic   . Thyroid disease     Past Surgical History:  Procedure Laterality Date  . ABDOMINAL HYSTERECTOMY     partial  . CESAREAN SECTION    . ENDOSCOPIC CONCHA BULLOSA RESECTION Right 03/19/2016   Procedure: ENDOSCOPIC CONCHA BULLOSA RESECTION;  Surgeon: Geanie LoganPaul [name], MD;  [facility];  Service: ENT;  Laterality: Right;  . MAXILLARY ANTROSTOMY Bilateral 03/19/2016   Procedure: MAXILLARY ANTROSTOMY;  Surgeon: Geanie LoganPaul [name], MD;  [facility];  Service: ENT;  Laterality: Bilateral;  . NASAL TURBINATE REDUCTION Bilateral 03/19/2016   Procedure: TURBINATE REDUCTION/SUBMUCOSAL RESECTION;  Surgeon: Geanie LoganPaul [name], MD;  [facility];  Service: ENT;  Laterality: Bilateral;  . OOPHORECTOMY     Dr. Valentino Saxonherry  . SEPTOPLASTY N/A 03/19/2016   Procedure: SEPTOPLASTY;  Surgeon: Geanie LoganPaul [name], MD;  [facility];  Service: ENT;  Laterality: N/A;  GAVE DISK TO [facility] 3/27 (IF NEEDED)    Family History  Problem Relation Age of Onset  . Hypertension Mother   . Heart failure Mother   . Alcohol abuse  Father   . Stroke Father   . Hypertension Father   . Heart failure Father   . Parkinsonism Brother   . Asthma Brother   . Hypertension Sister   . Cancer Maternal Aunt     Social History   Socioeconomic History  . Marital status: Single    Spouse name: Not on file  . Number of children: Not on file  . Years of education: Not on file  . Highest education level: Not on file  Occupational History  . Not on file  Tobacco Use  . Smoking status: Former Smoker    Packs/day: 0.50    Years: 30.00    Pack years: 15.00    Types: Cigarettes    Quit date: 2018    Years since quitting: 3.6  . Smokeless tobacco: Never Used  Vaping Use  . Vaping Use: Never used  Substance and Sexual Activity  . Alcohol use: No  . Drug use: No  . Sexual activity: Yes    Birth control/protection: None    Comment: hysterctomy  Other Topics Concern  . Not on file  Social History Narrative   Lives in [place]. 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      Diet - healthy   Exercise - none   Social Determinants of Health   Financial Resource Strain:   . Difficulty  of Paying Living Expenses: Not on file  Food Insecurity:   . Worried About Programme researcher, broadcasting/film/video in the Last Year: Not on file  . Ran Out of Food in the Last Year: Not on file  Transportation Needs:   . Lack of Transportation (Medical): Not on file  . Lack of Transportation (Non-Medical): Not on file  Physical Activity:   . Days of Exercise per Week: Not on file  . Minutes of Exercise per Session: Not on file  Stress:   . Feeling of Stress : Not on file  Social Connections:   . Frequency of Communication with Friends and Family: Not on file  . Frequency of Social Gatherings with Friends and Family: Not on file  . Attends Religious Services: Not on file  . Active Member of Clubs or Organizations: Not on file  . Attends Banker Meetings: Not on file  . Marital Status: Not on file  Intimate Partner Violence:   . Fear of Current or  Ex-Partner: Not on file  . Emotionally Abused: Not on file  . Physically Abused: Not on file  . Sexually Abused: Not on file     Current Outpatient Medications:  .  aspirin EC 81 MG tablet, Take 81 mg by mouth daily. am, Disp: , Rfl:  .  atorvastatin (LIPITOR) 80 MG tablet, Take 80 mg by mouth daily. am, Disp: , Rfl:  .  clopidogrel (PLAVIX) 75 MG tablet, Take 1 tablet (75 mg total) by mouth daily., Disp: 90 tablet, Rfl: 3 .  isosorbide mononitrate (IMDUR) 30 MG 24 hr tablet, Take 30 mg by mouth daily. am, Disp: , Rfl:  .  levothyroxine (SYNTHROID, LEVOTHROID) 75 MCG tablet, Take 75 mcg by mouth daily., Disp: , Rfl: 3 .  loratadine (CLARITIN) 10 MG tablet, Take 1 tablet (10 mg total) by mouth daily., Disp: 90 tablet, Rfl: 3 .  metoprolol succinate (TOPROL-XL) 25 MG 24 hr tablet, Take 25 mg by mouth daily. am, Disp: , Rfl:  .  naproxen sodium (ALEVE) 220 MG tablet, Take 220 mg by mouth., Disp: , Rfl:  .  pantoprazole (PROTONIX) 20 MG tablet, Take 20 mg by mouth daily. am, Disp: , Rfl:  .  ALPRAZolam (XANAX) 0.25 MG tablet, Take 1 tablet (0.25 mg total) by mouth at bedtime as needed for anxiety., Disp: 30 tablet, Rfl: 0   Allergies  Allergen Reactions  . No Known Allergies     ROS Review of Systems  Constitutional: Negative.   HENT: Negative.   Eyes: Negative.   Respiratory: Negative.  Negative for shortness of breath.   Cardiovascular: Negative.  Negative for chest pain, palpitations and leg swelling.  Gastrointestinal: Negative.   Endocrine: Negative.   Genitourinary: Negative.   Musculoskeletal: Negative.   Skin: Negative.   Allergic/Immunologic: Negative.   Neurological: Negative.   Hematological: Negative.   Psychiatric/Behavioral: The patient is nervous/anxious.   All other systems reviewed and are negative.    OBJECTIVE:    Physical Exam Vitals reviewed.  Constitutional:      Appearance: Normal appearance.  HENT:     Mouth/Throat:     Mouth: Mucous membranes  are moist.  Eyes:     Pupils: Pupils are equal, round, and reactive to light.  Neck:     Vascular: No carotid bruit.  Cardiovascular:     Rate and Rhythm: Normal rate and regular rhythm.     Pulses: Normal pulses.     Heart sounds: Normal heart sounds.  Pulmonary:     Effort: Pulmonary effort is normal.     Breath sounds: Normal breath sounds.  Abdominal:     General: Bowel sounds are normal.     Palpations: Abdomen is soft. There is no hepatomegaly, splenomegaly or mass.     Tenderness: There is no abdominal tenderness.     Hernia: No hernia is present.  Musculoskeletal:        General: No tenderness.     Cervical back: Neck supple.     Right lower leg: No edema.     Left lower leg: No edema.  Skin:    Findings: No rash.  Neurological:     Mental Status: She is alert and oriented to person, place, and time.     Motor: No weakness.  Psychiatric:        Mood and Affect: Mood and affect normal.        Behavior: Behavior normal.     BP (!) 159/101   Pulse 93   Ht 5\' 1"  (1.549 m)   Wt 132 lb 3.2 oz (60 kg)   BMI 24.98 kg/m  Wt Readings from Last 3 Encounters:  08/24/20 132 lb 3.2 oz (60 kg)  04/18/20 131 lb (59.4 kg)  09/28/19 130 lb (59 kg)    Health Maintenance Due  Topic Date Due  . COVID-19 Vaccine (1) Never done  . COLONOSCOPY  Never done  . PAP SMEAR-Modifier  09/13/2016  . TETANUS/TDAP  12/17/2019  . MAMMOGRAM  07/06/2020  . INFLUENZA VACCINE  07/16/2020    There are no preventive care reminders to display for this patient.  CBC Latest Ref Rng & Units 07/16/2014 07/15/2014 05/18/2014  WBC 3.6 - 11.0 x10 3/mm 3 11.3(H) 13.4(H) -  Hemoglobin 12.0 - 16.0 g/dL 07/18/2014 16.0 10.9  Hematocrit 35.0 - 47.0 % 40.8 43.9 -  Platelets 150 - 440 x10 3/mm 3 214 235 -   CMP Latest Ref Rng & Units 07/16/2014 07/15/2014 09/13/2013  Glucose 65 - 99 mg/dL 96 95 97  BUN 7 - 18 mg/dL 8 7 11   Creatinine 0.60 - 1.30 mg/dL 09/15/2013 0.7  Sodium 5.57 - 145 mmol/L 142 142 139    Potassium 3.5 - 5.1 mmol/L 3.6 3.6 4.2  Chloride 98 - 107 mmol/L 110(H) 112(H) 108  CO2 21 - 32 mmol/L 26 26 23   Calcium 8.5 - 10.1 mg/dL 8.6 9.0 9.3  Total Protein 6.4 - 8.2 g/dL 6.4 7.4 7.3  Total Bilirubin 0.2 - 1.0 mg/dL 0.4 0.6 0.7  Alkaline Phos Unit/L 66 76 65  AST 15 - 37 Unit/L 55(H) 47(H) 18  ALT U/L 24 24 14     Lab Results  Component Value Date   TSH 0.996 07/16/2014   Lab Results  Component Value Date   ALBUMIN 3.3 (L) 07/16/2014   ANIONGAP 6 (L) 07/16/2014   GFR 96.19 09/13/2013   Lab Results  Component Value Date   CHOL 162 07/16/2014   CHOL 202 (H) 09/13/2013   HDL 27 (L) 07/16/2014   HDL 40.40 09/13/2013   LDLCALC 86 07/16/2014   CHOLHDL 5 09/13/2013   Lab Results  Component Value Date   TRIG 243 (H) 07/16/2014   No results found for: HGBA1C    ASSESSMENT & PLAN:   Problem List Items Addressed This Visit      Cardiovascular and Mediastinum   Essential hypertension, benign    - Today, the patient's blood pressure is not well managed on metoprolol. - The  patient will continue the current treatment regimen.  - I encouraged the patient to eat a low-sodium diet to help control blood pressure. - I encouraged the patient to live an active lifestyle and complete activities that increases heart rate to 85% target heart rate at least 5 times per week for one hour.         CAD (coronary artery disease)    Pt has a history of CAD. She is on plavix and Imdur and beta-blocker. She denies any history of angina. She takes her lipitor 80mg  daily. She doesn't smoke or drink. She denies any history of chewing tobacco.         Endocrine   RESOLVED: Hypothyroidism    Stable at the present time. Will check labs in 4 months.         Other   Anxiety - Primary    - Patient experiencing high levels of anxiety.  - Encouraged patient to engage in relaxing activities like yoga, meditation, journaling, going for a walk, or participating in a hobby.  - Encouraged  patient to reach out to trusted friends or family members about recent struggles      Relevant Medications   ALPRAZolam (XANAX) 0.25 MG tablet      Meds ordered this encounter  Medications  . ALPRAZolam (XANAX) 0.25 MG tablet    Sig: Take 1 tablet (0.25 mg total) by mouth at bedtime as needed for anxiety.    Dispense:  30 tablet    Refill:  0    Follow-up: No follow-ups on file.    , MD [facility] [address] [place], [place]   By signing my name below, I, [id number], attest that this documentation has been prepared under the direction and in the presence of Dr. [employer] Electronically Signed: Corky Downs, MD 08/24/20, 4:32 PM  I personally performed the services described in this documentation, which was SCRIBED in my presence. The recorded information has been reviewed and considered accurate. It has been edited as necessary during review. 10/24/20, MD

## 2020-08-24 NOTE — Assessment & Plan Note (Signed)
Pt has a history of CAD. She is on plavix and Imdur and beta-blocker. She denies any history of angina. She takes her lipitor 80mg  daily. She doesn't smoke or drink. She denies any history of chewing tobacco.

## 2020-08-24 NOTE — Patient Instructions (Signed)

## 2020-08-24 NOTE — Assessment & Plan Note (Signed)
-   Today, the patient's blood pressure is not well managed on metoprolol. - The patient will continue the current treatment regimen.  - I encouraged the patient to eat a low-sodium diet to help control blood pressure. - I encouraged the patient to live an active lifestyle and complete activities that increases heart rate to 85% target heart rate at least 5 times per week for one hour.

## 2020-09-24 ENCOUNTER — Other Ambulatory Visit: Payer: Self-pay | Admitting: Internal Medicine

## 2020-09-24 DIAGNOSIS — F419 Anxiety disorder, unspecified: Secondary | ICD-10-CM

## 2020-10-25 ENCOUNTER — Other Ambulatory Visit: Payer: Self-pay | Admitting: *Deleted

## 2020-10-25 MED ORDER — LEVOTHYROXINE SODIUM 75 MCG PO TABS: 75.0000 ug | ORAL_TABLET | Freq: Every day | ORAL | 3 refills | Status: DC

## 2021-01-22 ENCOUNTER — Other Ambulatory Visit: Payer: Self-pay | Admitting: *Deleted

## 2021-01-22 MED ORDER — ATORVASTATIN CALCIUM 80 MG PO TABS: 80.0000 mg | ORAL_TABLET | Freq: Every day | ORAL | 3 refills | Status: DC

## 2021-01-22 MED ORDER — ISOSORBIDE MONONITRATE ER 30 MG PO TB24: 30.0000 mg | ORAL_TABLET | Freq: Every day | ORAL | 3 refills | Status: DC

## 2021-01-22 MED ORDER — METOPROLOL SUCCINATE ER 25 MG PO TB24: 25.0000 mg | ORAL_TABLET | Freq: Every day | ORAL | 3 refills | Status: DC

## 2021-01-25 ENCOUNTER — Other Ambulatory Visit: Payer: Self-pay | Admitting: *Deleted

## 2021-01-25 MED ORDER — METOPROLOL SUCCINATE ER 25 MG PO TB24: 25.0000 mg | ORAL_TABLET | Freq: Every day | ORAL | 3 refills | Status: DC

## 2021-02-19 ENCOUNTER — Other Ambulatory Visit: Payer: Self-pay | Admitting: Internal Medicine

## 2021-02-20 ENCOUNTER — Other Ambulatory Visit: Payer: Self-pay | Admitting: *Deleted

## 2021-02-20 MED ORDER — METOPROLOL SUCCINATE ER 25 MG PO TB24: 25.0000 mg | ORAL_TABLET | Freq: Every day | ORAL | 3 refills | Status: DC

## 2021-02-23 ENCOUNTER — Other Ambulatory Visit: Payer: Self-pay | Admitting: *Deleted

## 2021-02-23 MED ORDER — METOPROLOL SUCCINATE ER 25 MG PO TB24: 25.0000 mg | ORAL_TABLET | Freq: Every day | ORAL | 3 refills | Status: AC

## 2021-03-27 ENCOUNTER — Other Ambulatory Visit: Payer: Self-pay | Admitting: Internal Medicine

## 2021-04-30 ENCOUNTER — Other Ambulatory Visit: Payer: Self-pay | Admitting: *Deleted

## 2021-04-30 MED ORDER — FLUTICASONE PROPIONATE 50 MCG/ACT NA SUSP: 2.0000 | Freq: Every day | NASAL | 6 refills | Status: DC

## 2021-05-16 ENCOUNTER — Other Ambulatory Visit: Payer: Self-pay | Admitting: *Deleted

## 2021-05-16 MED ORDER — FLUTICASONE PROPIONATE 50 MCG/ACT NA SUSP: 2.0000 | Freq: Every day | NASAL | 6 refills | Status: DC

## 2021-06-06 ENCOUNTER — Other Ambulatory Visit: Payer: Self-pay

## 2021-06-06 MED ORDER — FLUTICASONE PROPIONATE 50 MCG/ACT NA SUSP: 2.0000 | Freq: Every day | NASAL | 6 refills | Status: AC

## 2021-07-07 ENCOUNTER — Other Ambulatory Visit: Payer: Self-pay | Admitting: Internal Medicine

## 2021-07-26 ENCOUNTER — Other Ambulatory Visit: Payer: Self-pay | Admitting: *Deleted

## 2021-07-26 DIAGNOSIS — Z1239 Encounter for other screening for malignant neoplasm of breast: Secondary | ICD-10-CM

## 2021-12-08 ENCOUNTER — Other Ambulatory Visit: Payer: Self-pay | Admitting: Internal Medicine

## 2022-03-16 ENCOUNTER — Other Ambulatory Visit: Payer: Self-pay | Admitting: Internal Medicine

## 2022-03-24 ENCOUNTER — Other Ambulatory Visit: Payer: Self-pay | Admitting: Internal Medicine

## 2022-06-30 ENCOUNTER — Other Ambulatory Visit: Payer: Self-pay | Admitting: Internal Medicine
